# Patient Record
Sex: Male | Born: 1945 | Race: Black or African American | Hispanic: No | Marital: Married | Smoking: Former smoker
Health system: Southern US, Community
[De-identification: ages and names within clinical notes are randomized; demographics above are authoritative.]

## PROBLEM LIST (undated history)

## (undated) DIAGNOSIS — I10 Essential (primary) hypertension: Secondary | ICD-10-CM

## (undated) DIAGNOSIS — E559 Vitamin D deficiency, unspecified: Secondary | ICD-10-CM

## (undated) DIAGNOSIS — N529 Male erectile dysfunction, unspecified: Secondary | ICD-10-CM

## (undated) DIAGNOSIS — A0472 Enterocolitis due to Clostridium difficile, not specified as recurrent: Secondary | ICD-10-CM

## (undated) DIAGNOSIS — G4730 Sleep apnea, unspecified: Secondary | ICD-10-CM

## (undated) DIAGNOSIS — N393 Stress incontinence (female) (male): Secondary | ICD-10-CM

## (undated) DIAGNOSIS — C61 Malignant neoplasm of prostate: Secondary | ICD-10-CM

## (undated) DIAGNOSIS — E7800 Pure hypercholesterolemia, unspecified: Secondary | ICD-10-CM

## (undated) DIAGNOSIS — M8580 Other specified disorders of bone density and structure, unspecified site: Secondary | ICD-10-CM

## (undated) DIAGNOSIS — E119 Type 2 diabetes mellitus without complications: Secondary | ICD-10-CM

## (undated) DIAGNOSIS — K760 Fatty (change of) liver, not elsewhere classified: Secondary | ICD-10-CM

## (undated) HISTORY — PX: FOOT SURGERY: SHX648

## (undated) HISTORY — PX: OTHER SURGICAL HISTORY: SHX169

## (undated) HISTORY — PX: LIVER BIOPSY: SHX301

---

## 2001-03-01 ENCOUNTER — Encounter: Payer: Self-pay | Admitting: Emergency Medicine

## 2001-03-01 ENCOUNTER — Emergency Department (HOSPITAL_COMMUNITY): Admission: EM | Admit: 2001-03-01 | Discharge: 2001-03-01 | Payer: Self-pay | Admitting: Emergency Medicine

## 2001-07-02 ENCOUNTER — Ambulatory Visit (HOSPITAL_BASED_OUTPATIENT_CLINIC_OR_DEPARTMENT_OTHER): Admission: RE | Admit: 2001-07-02 | Discharge: 2001-07-02 | Payer: Self-pay | Admitting: Family Medicine

## 2001-08-01 ENCOUNTER — Ambulatory Visit (HOSPITAL_COMMUNITY): Admission: RE | Admit: 2001-08-01 | Discharge: 2001-08-01 | Payer: Self-pay | Admitting: Gastroenterology

## 2001-08-20 ENCOUNTER — Ambulatory Visit (HOSPITAL_BASED_OUTPATIENT_CLINIC_OR_DEPARTMENT_OTHER): Admission: RE | Admit: 2001-08-20 | Discharge: 2001-08-20 | Payer: Self-pay | Admitting: Family Medicine

## 2001-09-03 ENCOUNTER — Encounter: Payer: Self-pay | Admitting: General Surgery

## 2001-09-08 ENCOUNTER — Encounter (INDEPENDENT_AMBULATORY_CARE_PROVIDER_SITE_OTHER): Payer: Self-pay | Admitting: *Deleted

## 2001-09-08 ENCOUNTER — Ambulatory Visit (HOSPITAL_COMMUNITY): Admission: RE | Admit: 2001-09-08 | Discharge: 2001-09-09 | Payer: Self-pay | Admitting: General Surgery

## 2001-09-08 ENCOUNTER — Encounter: Payer: Self-pay | Admitting: General Surgery

## 2001-12-16 ENCOUNTER — Encounter: Payer: Self-pay | Admitting: Family Medicine

## 2001-12-16 ENCOUNTER — Encounter: Admission: RE | Admit: 2001-12-16 | Discharge: 2001-12-16 | Payer: Self-pay | Admitting: Family Medicine

## 2002-08-17 ENCOUNTER — Encounter: Admission: RE | Admit: 2002-08-17 | Discharge: 2002-08-17 | Payer: Self-pay | Admitting: Family Medicine

## 2002-08-17 ENCOUNTER — Encounter: Payer: Self-pay | Admitting: Family Medicine

## 2003-05-06 ENCOUNTER — Encounter: Admission: RE | Admit: 2003-05-06 | Discharge: 2003-05-06 | Payer: Self-pay | Admitting: Family Medicine

## 2004-09-12 ENCOUNTER — Encounter: Admission: RE | Admit: 2004-09-12 | Discharge: 2004-09-12 | Payer: Self-pay | Admitting: Family Medicine

## 2004-11-10 ENCOUNTER — Encounter: Admission: RE | Admit: 2004-11-10 | Discharge: 2004-11-10 | Payer: Self-pay | Admitting: Family Medicine

## 2006-01-15 ENCOUNTER — Encounter: Admission: RE | Admit: 2006-01-15 | Discharge: 2006-01-15 | Payer: Self-pay | Admitting: Family Medicine

## 2006-08-27 ENCOUNTER — Encounter: Admission: RE | Admit: 2006-08-27 | Discharge: 2006-08-27 | Payer: Self-pay | Admitting: Family Medicine

## 2006-10-26 ENCOUNTER — Emergency Department (HOSPITAL_COMMUNITY): Admission: EM | Admit: 2006-10-26 | Discharge: 2006-10-26 | Payer: Self-pay | Admitting: Emergency Medicine

## 2006-11-14 ENCOUNTER — Encounter: Admission: RE | Admit: 2006-11-14 | Discharge: 2007-02-12 | Payer: Self-pay | Admitting: Otolaryngology

## 2006-11-26 ENCOUNTER — Encounter: Admission: RE | Admit: 2006-11-26 | Discharge: 2006-11-26 | Payer: Self-pay | Admitting: Family Medicine

## 2007-07-01 ENCOUNTER — Ambulatory Visit (HOSPITAL_COMMUNITY): Admission: RE | Admit: 2007-07-01 | Discharge: 2007-07-01 | Payer: Self-pay | Admitting: Urology

## 2007-07-24 ENCOUNTER — Encounter (INDEPENDENT_AMBULATORY_CARE_PROVIDER_SITE_OTHER): Payer: Self-pay | Admitting: Urology

## 2007-07-24 ENCOUNTER — Inpatient Hospital Stay (HOSPITAL_COMMUNITY): Admission: RE | Admit: 2007-07-24 | Discharge: 2007-07-26 | Payer: Self-pay | Admitting: Urology

## 2008-06-11 ENCOUNTER — Encounter: Admission: RE | Admit: 2008-06-11 | Discharge: 2008-06-11 | Payer: Self-pay | Admitting: Family Medicine

## 2008-08-27 ENCOUNTER — Inpatient Hospital Stay (HOSPITAL_COMMUNITY): Admission: EM | Admit: 2008-08-27 | Discharge: 2008-08-30 | Payer: Self-pay | Admitting: Internal Medicine

## 2008-08-30 ENCOUNTER — Encounter (INDEPENDENT_AMBULATORY_CARE_PROVIDER_SITE_OTHER): Payer: Self-pay | Admitting: Internal Medicine

## 2010-04-14 IMAGING — CR DG CHEST 2V
2 series · 2 of 2 positions shown · non-contrast
Comparison: 07/18/2007

CLINICAL DATA: Stroke, hypertension, question pneumonia, altered
mental status

CHEST - 2 VIEW

[w chest pa]
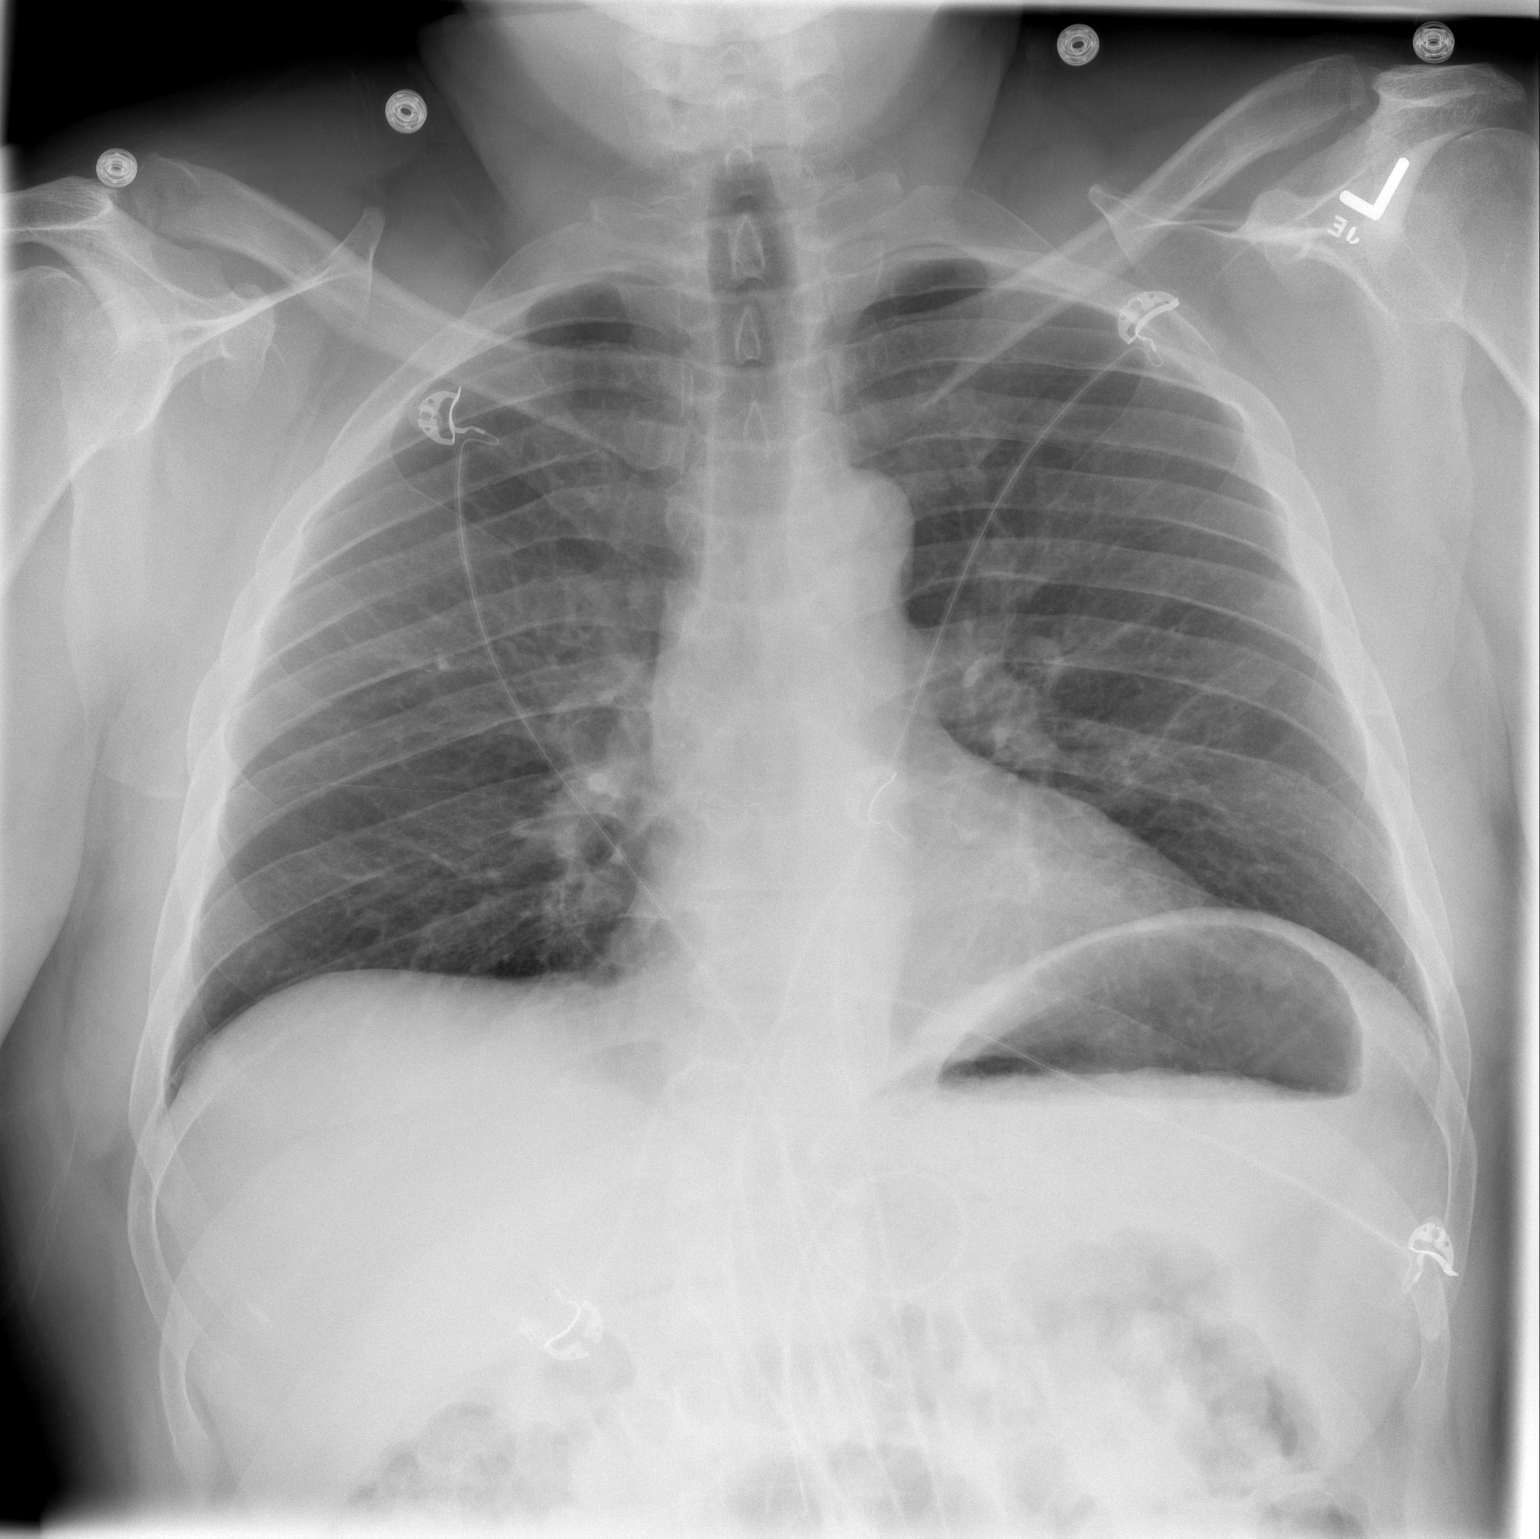

[w chest lat]
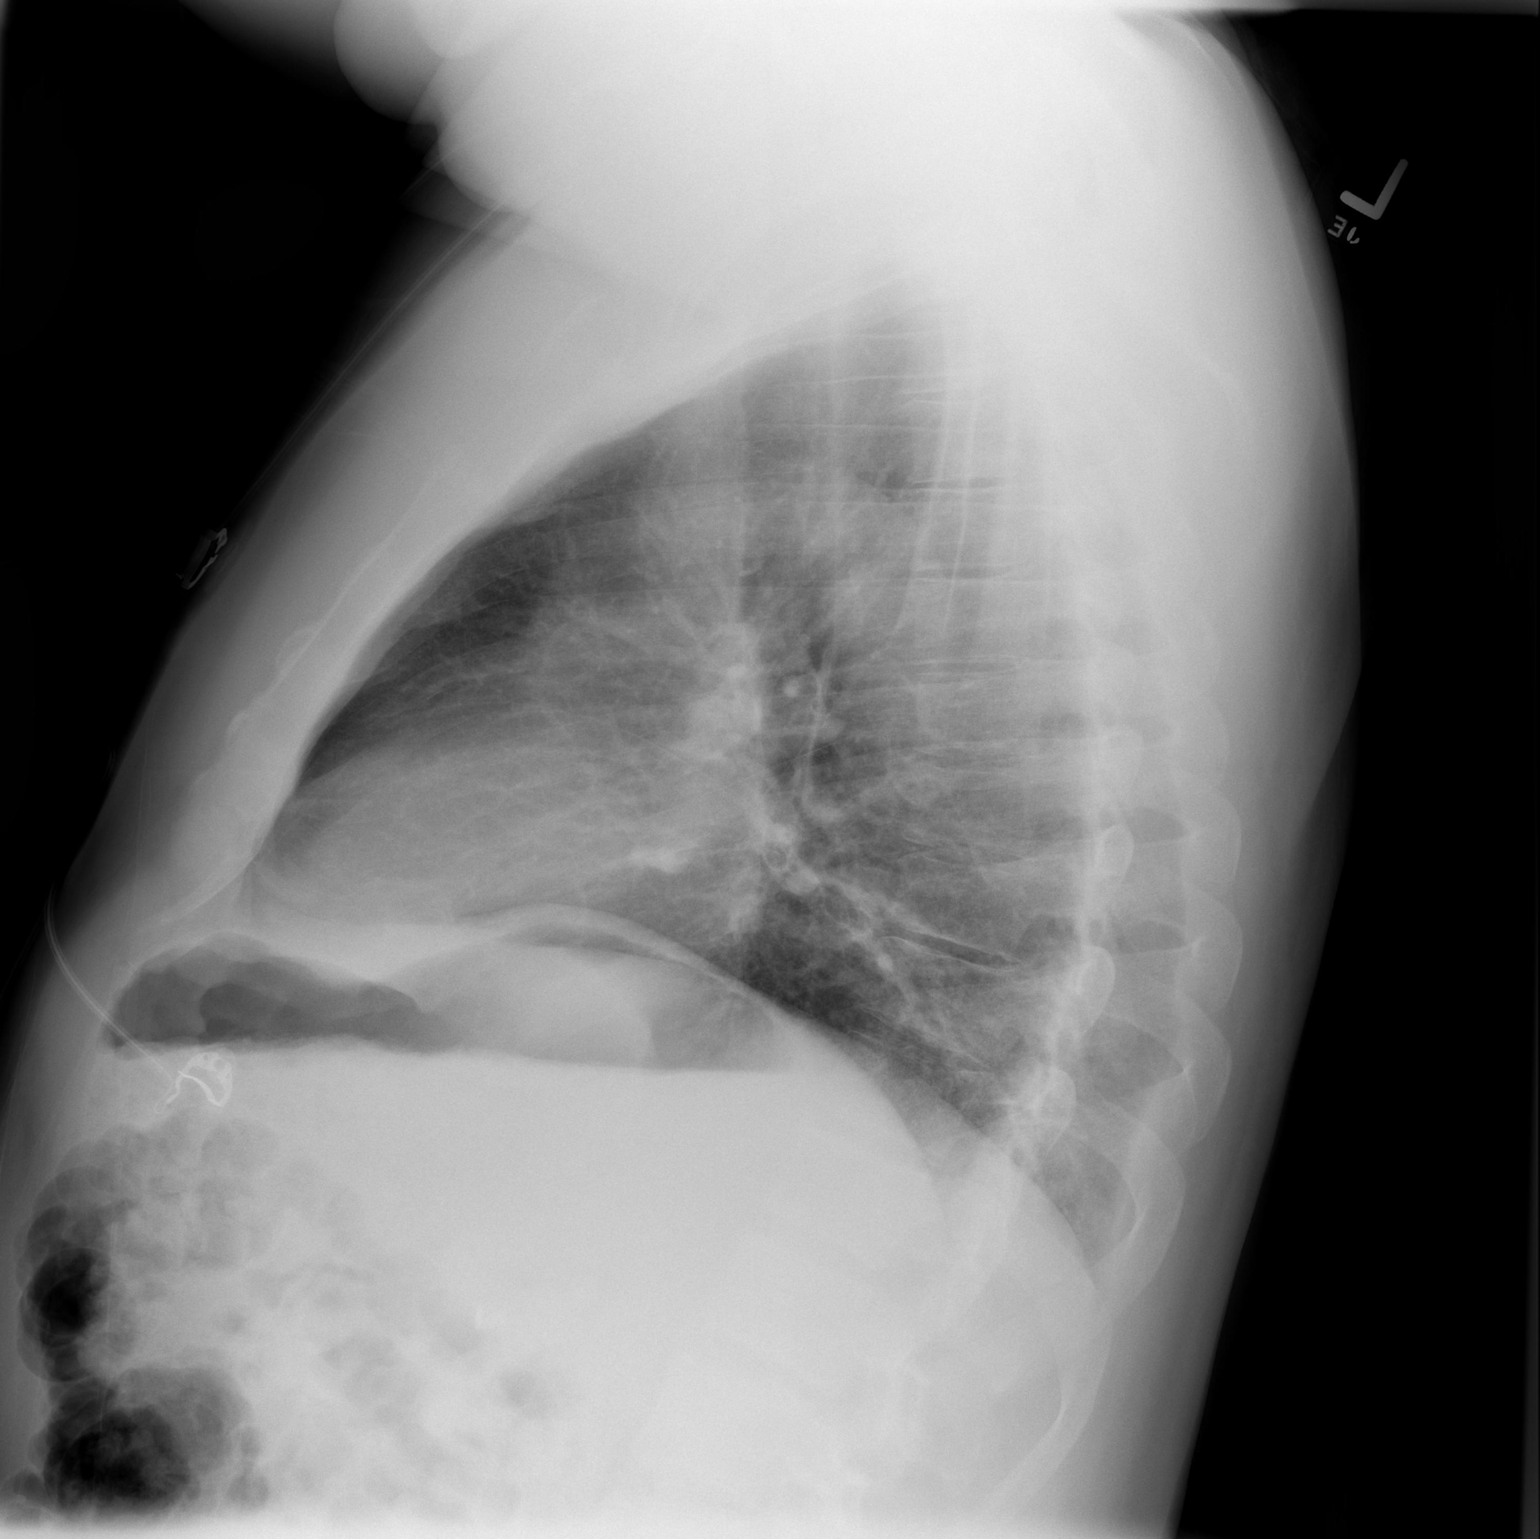

[2 of 2 positions shown; findings below may reference images not displayed]

FINDINGS: Normal heart size, mediastinal contours, and pulmonary vascularity.
Minimal bronchitic changes.
Lungs otherwise clear.
Bones unremarkable.
IMPRESSION: Minimal bronchitic changes.

## 2010-06-04 LAB — CBC
HCT: 43.1 % (ref 39.0–52.0)
Hemoglobin: 14.8 g/dL (ref 13.0–17.0)
MCHC: 34.4 g/dL (ref 30.0–36.0)
Platelets: 181 10*3/uL (ref 150–400)
RDW: 14 % (ref 11.5–15.5)
WBC: 7.1 10*3/uL (ref 4.0–10.5)

## 2010-06-04 LAB — CARDIAC PANEL(CRET KIN+CKTOT+MB+TROPI)
CK, MB: 4.5 ng/mL — ABNORMAL HIGH (ref 0.3–4.0)
Total CK: 303 U/L — ABNORMAL HIGH (ref 7–232)
Troponin I: 0.01 ng/mL (ref 0.00–0.06)

## 2010-06-04 LAB — LIPID PANEL
HDL: 29 mg/dL — ABNORMAL LOW (ref >39)
LDL Cholesterol: 88 mg/dL (ref 0–99)
Triglycerides: 69 mg/dL (ref <150)

## 2010-06-04 LAB — COMPREHENSIVE METABOLIC PANEL
AST: 27 U/L (ref 0–37)
Albumin: 3.7 g/dL (ref 3.5–5.2)
Alkaline Phosphatase: 46 U/L (ref 39–117)
Chloride: 104 mEq/L (ref 96–112)
GFR calc Af Amer: 58 mL/min — ABNORMAL LOW (ref >60)
Potassium: 3.8 mEq/L (ref 3.5–5.1)
Sodium: 140 mEq/L (ref 135–145)
Total Bilirubin: 0.8 mg/dL (ref 0.3–1.2)

## 2010-06-04 LAB — GLUCOSE, CAPILLARY
Glucose-Capillary: 100 mg/dL — ABNORMAL HIGH (ref 70–99)
Glucose-Capillary: 107 mg/dL — ABNORMAL HIGH (ref 70–99)
Glucose-Capillary: 93 mg/dL (ref 70–99)

## 2010-06-04 LAB — T4, FREE: Free T4: 1.05 ng/dL (ref 0.80–1.80)

## 2010-06-04 LAB — HEMOGLOBIN A1C: Hgb A1c MFr Bld: 5.3 % (ref 4.6–6.1)

## 2010-06-04 LAB — HOMOCYSTEINE: Homocysteine: 12.9 umol/L (ref 4.0–15.4)

## 2010-06-04 LAB — VITAMIN B12: Vitamin B-12: 1020 pg/mL — ABNORMAL HIGH (ref 211–911)

## 2010-06-04 LAB — URINALYSIS, ROUTINE W REFLEX MICROSCOPIC
Bilirubin Urine: NEGATIVE
Ketones, ur: NEGATIVE mg/dL
Nitrite: NEGATIVE

## 2010-06-04 LAB — RPR: RPR Ser Ql: NONREACTIVE

## 2010-07-11 NOTE — Op Note (Signed)
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   POSTOPERATIVE DIAGNOSIS:  Clinically-localized adenocarcinoma of  prostate (clinical stage T1C N0 M0).   PROCEDURES:  1. Robotic-assisted laparoscopic radical prostatectomy (bilateral      partial nerve-sparing).  2. Bilateral laparoscopic pelvic lymphadenectomy.   SURGEON:  Heloise Purpura, MD   ASSISTANT:  Melina Schools, MD   ANESTHESIA:  General.   COMPLICATIONS:  None.   ESTIMATED BLOOD LOSS:  200 mL.   INTRAVENOUS FLUIDS:  2000 mL of lactated Ringer's.   SPECIMENS:  1. Prostate and seminal vesicles.  2. Right pelvic lymph nodes.  3. Left pelvic lymph nodes.   DISPOSITION OF SPECIMENS:  To pathology.   DRAINS:  1. A 20-French Coude catheter.  2. A #19 Blake pelvic drain.   INDICATIONS:  Mr. Jeffrey [name] is a 65 year old gentleman with high-risk,  clinically-localized adenocarcinoma of the prostate.  After a discussion  regarding management options for treatment, he elected to proceed with  surgical therapy and the above procedures.  Potential risks,  complications and alternative options were discussed with the patient in  detail and informed consent was obtained.   DESCRIPTION OF PROCEDURE:  The patient was taken to the operating room  and a general anesthetic was administered.  He was given preoperative  antibiotics, placed in the dorsal lithotomy position, and prepped and  draped in the usual sterile fashion.  Next a preoperative time-out was  performed.  A Foley catheter was then inserted into the bladder and a  site  was selected just superior to the umbilicus for placement of the  camera port.  This was placed using a standard open Hasson technique.  This allowed entry into the peritoneal cavity under direct vision and  without difficulty.  A 12-mm port was then placed and a pneumoperitoneum  was established.  The 0-degree lens was used to inspect the abdomen and  there was no evidence of any intra-abdominal injuries or other  abnormalities.  The remaining ports were then placed.  Bilateral 8-mm  robotic ports were placed 10 cm lateral to and just inferior to the  camera port site.  An additional 8-mm robotic port was placed in the far  left lateral abdominal wall.  A 5-mm port was placed between the camera  port and the right robotic port.  An additional 12-mm port was placed in  the far right lateral abdominal wall for laparoscopic assistance.  All  ports were placed under direct vision and without difficulty.  The  surgical cart was then docked.  With the aid of the cautery scissors,  the bladder was reflected posteriorly allowing entry into the space of  Retzius and identification of  the endopelvic fascia and prostate.  The  endopelvic fascia was then incised from the apex back to the base of  prostate bilaterally and the underlying levator muscle fibers were swept  laterally off the prostate.  This isolated the dorsal venous complex,  which was then stapled and divided with a 45-mm flex ETS stapler.  The  bladder neck was then identified with the aid of Foley catheter  manipulation and was divided anteriorly, thereby exposing the Foley  catheter.  The catheter balloon was deflated and the catheter was  brought into the operative field and used to retract the prostate  anteriorly.  This helped to expose the posterior bladder neck, which was  then divided and dissection proceeded between the bladder neck and  prostate until the vasa deferentia and seminal vesicles were identified.  The vasa  deferentia were isolated, divided and lifted anteriorly.  The  seminal vesicles were dissected down to their tips with care to control  seminal vesicle arterial blood supply and then lifted anteriorly.  The  space between Denonvilliers fascia and the anterior rectum was bluntly  developed, thereby isolating the vascular pedicles of the prostate.  The  lateral prostatic fascia was incised sharply, allowing the neurovascular  bundles to be released posteriorly and laterally.  Hem-o-lok clips were  used to ligate the vascular pedicles of the prostate.  Cold scissors  dissection was then used to divide the vascular pedicles of the prostate  and care was taken to perform a partial nerve sparing procedure making  sure to leave at least a few millimeters of fatty tissue along the  posterolateral aspect of the prostate.  The neurovascular bundles were  then swept off the apex of the prostate and urethra and the urethra was  sharply divided, allowing the prostate specimen to be disarticulated.  The pelvis was then copiously irrigated and hemostasis was ensured.  There was no evidence of a rectal injury.  Attention then turned to the  right pelvic sidewall.  The fibrofatty tissue between the external iliac  vein, confluence of the iliac vessels, hypogastric artery and Cooper's  ligament was dissected free from the pelvic sidewall with care to  preserve the obturator nerve.  Hem-o-lok clips were used for  lymphostasis and hemostasis.  The specimen was then passed off for  permanent pathologic analysis.  An identical procedure was then  performed on the contralateral side.  Attention then turned to the  urethral anastomosis.  A 2-0 Vicryl slip-knot was placed between  Denonvilliers fascia, the posterior bladder neck and the posterior  urethra to reapproximate these structures.  A double-armed 3-0 Monocryl  suture was then used to perform a 360-degree running tension-free  anastomosis between the  bladder neck and urethra.  A new 20-French  straight catheter was inserted into the bladder and irrigated.  There  were no blood clots within the bladder and the anastomosis appeared to  be watertight.  A #19 Blake drain was then brought through the left  robotic port and appropriately positioned in the pelvis.  It was secured  to the skin with a nylon suture.  The surgical cart was then undocked.  The right lateral 12-mm port site was closed with a 0 Vicryl suture  placed in the aid of a Carter-Thomason device.  All remaining ports were  then removed under direct vision and the prostate specimen was removed  intact within the Endopouch retrieval bag.  This fascial opening at the  umbilicus was then closed with  a running 0 Vicryl suture.  All port  sites were injected with 0.25% Marcaine and reapproximated at the skin  level with staples.  Sterile dressings were applied.  The patient  appeared to tolerate the procedure well and without complications.  He  was able to be extubated and transferred to the recovery unit in  satisfactory condition.     Heloise Purpura, MD  Electronically Signed    LB/MEDQ  D:  07/24/2007  T:  07/24/2007  Job:  [id number]

## 2010-07-11 NOTE — H&P (Signed)
[name], [name]              ACCOUNT NO.:  [id number]   MEDICAL RECORD NO.:  [id number]          PATIENT TYPE:  INP   LOCATION:  3039                         FACILITY:  [facility]   PHYSICIAN:  Richarda Overlie, MD       DATE OF BIRTH:  04/06/1945   DATE OF ADMISSION:  08/27/2008  DATE OF DISCHARGE:                              HISTORY & PHYSICAL   PRIMARY CARE PHYSICIAN:  Unassigned.   SUBJECTIVE:  This is a 65 year old male with a history of dyslipidemia,  hypertension, who presented to the ER at [facility],  brought in by his wife because of altered mental status and inability to  remember any events related to his personal life as well as his recent  travel to visit his mother, where he has been staying since last Sunday.  The patient was found to be disoriented and confused this morning and  could not answer any questions appropriately.  The wife got concerned  and brought him to the ER at [facility], where he told  that the possibility of stroke could not be ruled out.  The patient was  also found to have some dysarthria and a frontal headache but no obvious  unilateral weakness, no gross focal neurologic deficits.  The patient  had a negative evaluation and requested transfer to [facility] for further evaluation.  At the ER, the patient's CT scan was  negative, and his blood work was essentially negative.   PAST MEDICAL HISTORY:  Hypertension.  Dyslipidemia.  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  In the father, no history of CVA or diabetes or coronary artery disease.   ALLERGIES:  TETRACYCLINE.   PHYSICAL EXAMINATION:  VITAL SIGNS:  Blood pressure 148/ 80, pulse of  77, respirations 22 and temperature 98.4.  GENERAL:   The patient appears currently to be comfortable in no acute  distress, is alert and oriented x3.  HEENT: Pupils equal and reactive.  Extraocular movements intact.  LUNGS:  Clear to auscultation bilaterally.  No wheezes or crackles or  rhonchi.  CARDIOVASCULAR:  Regular rate and rhythm.  No appreciable murmurs, rubs  or gallops.  ABDOMEN:  Soft, nontender, nondistended.  EXTREMITIES:  Without cyanosis, clubbing or edema.  NEUROLOGIC:  Cranial nerves II-XII grossly intact.  No gross focal  neurologic deficits.  Gait without any disturbance.  Motor and strength  intact in bilateral upper and lower extremities.  Deep tendon reflexes  2+ bilaterally.  LYMPH:  No palpable axillary, inguinal lymphadenopathy.   EKG shows normal sinus rhythm with a right axis deviation.   Sodium 141, potassium 4.2, chloride 107, bicarb 27, anion gap 7, glucose  106, BUN 16, creatinine 1.47, calcium 10.1.  WBCs 7.7, hemoglobin 15.7,  hematocrit 45.3, platelet count was 202.   CT scan of the head without contrast was negative.   Ultrasound did not  show any obvious stenosis.   HOME MEDICATIONS:  Lisinopril/hydrochlorothiazide 20/12.5 p.o. daily  , Allegra 180 mg p.o. daily,  Crestor on 10 mg every other day.   ASSESSMENT AND PLAN:  Confusion, now resolved.  Differential could  include atypical migraine versus seizure versus transient ischemic  attack/cerebrovascular accident.  The patient will be admitted to a tele  bed with neuro checks.  Neuro checks every 2 hours.  Will obtain an MRI,  MRA of the brain in the morning to rule out a CVA.  The patient will  also have a 2-D echo.  Will obtain a chest x-ray PA and lateral, an EEG  in the morning to rule out any underlying seizure disorder.  The patient  is afebrile.  No meningismus.  Therefore, I doubt  meningitis/encephalitis.  Will evaluate for risk factors.  Will do a  lipid panel in the morning.  Because of the patient's complaints of  altered memory on  and off for the last several months, will do a basic  dementia workup with thyroid function tests, B12, RPR, urine drug  screen.  Will also obtain a PT/OT consultation in the morning.  He is a  full code.      Richarda Overlie, MD  Electronically Signed     NA/MEDQ  D:  08/27/2008  T:  08/27/2008  Job:  [id number]

## 2010-07-11 NOTE — Consult Note (Signed)
[name], [name]              ACCOUNT NO.:  [id number]   MEDICAL RECORD NO.:  [id number]          PATIENT TYPE:  INP   LOCATION:  3039                         FACILITY:  [facility]   PHYSICIAN:  Levert Feinstein, MD          DATE OF BIRTH:  1945-10-23   DATE OF CONSULTATION:  DATE OF DISCHARGE:  08/30/2008                                 CONSULTATION   HISTORY:  The patient is a 65 year old right-handed African American  male, presenting with acute onset of memory loss on Friday August 27, 2008.   He had a past medical history of dyslipidemia, hypertension, was  visiting and taking care of his sick mother, and woke up on Friday  morning, wife noticed he could not remember anything.  The wife found he  was disoriented, confused, but there were no focal signs, he was  initially taken to a local ER at [facility], later  transferred to the [facility].   The patient reports memory loss lasted about 6-7 hours, he could  remember the conversation of transferring him, and his ride to [facility] that Friday afternoon.  He is back to his normal self now, this is  the initial episode.  He denies dysarthria, aphasia, and lateralized  motor sensory deficit.   REVIEW OF SYSTEMS:  Pertinent as above, he was previously elevated by  [facility] neurologist for gait difficulty, determined to be left inner  ear problem, but I do not have the exact name, or the record.   PAST MEDICAL HISTORY:  Hypertension and hyperlipidemia.   FAMILY HISTORY:  Mother has hypertension and hyperlipidemia.   ALLERGY:  TETRACYCLINE.   SOCIAL HISTORY:  Denies smoking, drinking, currently unemployed.   CURRENT MEDICATIONS:  Aspirin, hydrochlorothiazide, lisinopril, Crestor,  and labetalol p.r.n.   PHYSICAL EXAMINATION:  VITAL SIGNS:  Temperature 98.2, heart rate of 88,  blood pressure 125/77, and saturating 96%.  CARDIAC:  Regular rate and rhythm.  PULMONARY:  Clear to auscultation bilaterally.  NECK:   Supple, no carotid bruits.  NEUROLOGIC:  He is awake, alert, oriented to history taking, care of  conversation.  Cranial nerves II through XII are normal.  Pupils  equal,  round, and reactive to light.  Extraocular movements are full.  Facial  sensation is normal.  Visual fields are full on confrontational test.   Motor examination normal tone, bulk, and strength.  Deep tendon reflex,  brisk and symmetric.  Sensory intact to light touch, pinprick,  and  coordination normal finger-to-nose, heel-to-shin.  Gait was deferred.   MRI of the brain without contrast, no acute change.  MRA of the brain,  there was mild pouching, 2 mm, on the right M1 segment.   MRA of the neck has demonstrated mild irregularity, left worse than  right, but there was no hemodynamic significant stenosis.   LABORATORY EVALUATION:  Normal homocystine; lipid profile; CMP was  mildly elevated with creatinine 1.5, glucose 101; CBC was normal.   ASSESSMENT AND PLAN:  35. A 65 year old gentleman presenting with transient global amnesia      lasting  6-7 hours, now resolved.  workup were negative.  He will      benefit a followup with neurologist that can be done at [facility]      neurologist.  2. Address vascular risk factor, weight loss, regular healthy diet,      blood pressure control, and daily aspirin.      Levert Feinstein, MD  Electronically Signed     YY/MEDQ  D:  08/30/2008  T:  08/31/2008  Job:  [id number]

## 2010-07-11 NOTE — Discharge Summary (Signed)
NAMELOWRY, BALA NO.:  [id number]   MEDICAL RECORD NO.:  [id number]          PATIENT TYPE:  INP   LOCATION:  3039                         FACILITY:  [facility]   PHYSICIAN:  Acey Lav, MD  DATE OF BIRTH:  [birth year]   DATE OF ADMISSION:  08/27/2008  DATE OF DISCHARGE:                               DISCHARGE SUMMARY   DISCHARGE DIAGNOSES:  1. Transient global amnesia, unclear cause.  2. Hypertension.  3. Hyperlipidemia.  4. Mild narrowing of the proximal right vertebral artery.  5. Minimal irregularity of the proximal right internal carotid artery      without stenosis.  6. Two areas of focal narrowing proximal left internal carotid artery      suggestive of plaque or possible ulceration but no evidence of      hemodynamically significant stenosis.  7. Nonspecific asymptomatic elevation of creatinine phosphokinase.   PERTINENT LABORATORY DATA AND RADIOGRAPHIC STUDIES:  MRI of brain and  MRA of the neck:  MRI head no acute infarct, no intracranial hemorrhage,  no intracranial abnormality, no hydrocephalus, no acute infarcts.  MRA  of the head:  There is mild narrowing of the supraclinoid aspect of the  right internal carotid artery, fetal-type origin of the posterior  cerebral arteries bilaterally.  Right vertebral artery is dominant.  Ectatic vertebral arteries and basilar artery.  Nonvisualization of the  PICA's, mid branch vessel irregularity.  Additionally, there is a 2-mm  bulge distal to the M1 segment of left middle cerebral artery projecting  posteriorly.  A vessel arising from this cannot be confirmed, and  therefore, a tiny aneurysm cannot be excluded.  Otherwise no evidence of  aneurysm.   MRA of the neck:  Left vertebral artery origin appears to be at the  aortic arch immediately adjacent to left subclavian artery, mild  narrowing proximal to the right vertebral artery.  Right vertebral  artery is slightly dominant sized.  Minimal  irregularity proximal right  internal carotid artery without measurable stenosis.  Two areas of focal  proximal left internal carotid area narrowing suggestive of plaque or  possible ulceration at the level of maximal stenosis in the left  internal carotid artery.  It measures 3.8 mm whereas behind the bulb it  measures 4.1 mm and is not hemodynamically significant.   Chest x-ray:  No acute abnormalities other than some minimal bronchitic  changes.   Echocardiogram done on the 5th shows normal systolic ejection fraction  with a range of 60-65%.  The images were inadequate to evaluate LV wall  function.  Aortic valve was poorly visualized but structurally normal  from what could be seen.  No stenosis, no regurgitation.  Aortic valve  normal in size.  The ascending aorta normal in size.  Mitral valve  poorly visualized, no significant regurgitation.  Left atrium normal in  size.  Atrial septum poorly visualized.  Right ventricle catheter was  normal.  Wall thickness normal.  Systolic function normal.  Systolic  pressure within normal.  Pulmonary valve poorly visualized.  No  significant regurgitation.  Tricuspid valve poorly visualized.  No  significant  regurgitation.  Pulmonary artery poorly visualized.  Right  atrium normal size.  No pericardial effusion.   RPR negative.  Hemoglobin A1c 5.3.  Vitamin B12 level 1020.  Homocysteine level 12.9.  Lipid profile:  Cholesterol 131, triglycerides  69, HDL 29, LDL 88.  Thyroid stimulating hormone 1.551, T4 1.05.   HOSPITAL COURSE:  Briefly, this is a 65 year old African American  gentleman with history of hyperlipidemia and hypertension who presented  to the emergency room at [facility], brought there by  his wife due to acute onset of confusion and inability to remember any  recent events related to personal life as well as recent travel to visit  his sick mother.  The patient was brought, was found to be disoriented  in the  emergency room and confused and had headache.  He also had some  dysarthria but otherwise had no focal findings.  He was brought to [facility] emergency department where CT of the head showed no abnormalities.  By the time he was seen emergency room, his amnesia had resolved.  He  was admitted to the neurology floor.  His lipid profile was checked.  He  was initially allowed to be progressively hypertensive, and then he was  started back on his home medications for blood pressure and cholesterol.  He was started on aspirin which caused some dyspepsia and then was  restarted on an enteric coated aspirin.  He had workup including MRI and  MRA of the brain which is described above.  As mentioned, there is a  bulge in the M1 segment of the MCA on the left middle cerebral artery in  which exclusion of an aneurysm cannot be made, although there is no  actual aneurysm that was seen.  He also had some nonsignificant  narrowing of his internal carotid artery was noted on MRA of the neck.  The patient was observed on telemetry and had no events on telemetry.  His blood sugars were normal, and he was doing well.  He was seen by  physical therapy and  occupational therapy who all found that he was  functioning completely independently and did not need to have any  further needs from physical therapy/occupational therapy, I called  neurology because there had been some mention of doing an EEG initially.  After taking to Dr. Terrace Arabia, she felt that EEG would be essentially a very  low yield at this point almost 4 days out the patient's event.  She felt  the patient suffered from global amnesia.  She thought he could be  evaluated as an outpatient.  However, the patient really wanted to be  seen by a neurologist to have all of his diagnoses clarified.  Therefore, Dr. Terrace Arabia is going to see the patient today.  I anticipate  discharging the patient to home today with a statin and his home   lisinopril/hydrochlorothiazide combination along with an enteric-coated  aspirin.   DISCHARGE INSTRUCTIONS:  The patient is to follow-up with primary care  physician and also to follow-up with a neurologist with either will be  Dr. Zannie Cove group with [facility] or with a  neurologist he has known at [facility].  He may  potentially get an EEG as an outpatient at that point in time.      Acey Lav, MD  Electronically Signed     CV/MEDQ  D:  08/30/2008  T:  08/30/2008  Job:  [id number]   cc:  Levert Feinstein, MD

## 2010-07-14 NOTE — Op Note (Signed)
[place]. [facility]  Patient:    [name], [name] Visit Number: [id number] MRN: [id number]          Service Type: DSU Location: [phone] Attending Physician:  Arlis Porta Dictated by:   Adolph Pollack, M.D. Proc. Date: 09/08/01 Admit Date:  09/08/2001 Discharge Date: 09/09/2001   CC:         [name], M.D.  Anselmo Rod, M.D.   Operative Report  PREOPERATIVE DIAGNOSES: 1. Gallbladder polyp with abnormal liver function tests. 2. Left inguinal hernia.  POSTOPERATIVE DIAGNOSES: 1. Gallbladder polyp with abnormal liver function tests. 2. Left inguinal hernia.  PROCEDURE: 1. Laparoscopic cholecystectomy with intraoperative cholangiogram. 2. TruCut liver biopsy. 3. Left inguinal hernia repair with mesh.  SURGEON:  Adolph Pollack, M.D.  ASSISTANT:  Gita Kudo, M.D.  ANESTHESIA:  General.  INDICATIONS:  This patient is a 65 year old male with some chronic elevation of liver function tests.  He also has some abdominal pain.  An ultrasound demonstrated a mass in the gallbladder, which was felt to be consistent with a polyp.  A CT scan demonstrated a left inguinal hernia containing small bowel in it.  He now presents for elective above procedures.  TECHNIQUE:  His left groin was marked. He was placed supine on the operating table, and a general anesthetic was administered.  The abdomen and groin were sterilely prepped and draped.  A small subumbilical incision was made, incising the skin sharply.  The subcutaneous tissue was dissected bluntly until the fascia was identified, and a 1-1.5 cm incision was made in the fascia.  The peritoneal cavity was then entered bluntly and then under direct vision.  A pursestring suture of 0 Vicryl was placed around the fascial edges. A Hasson trocar was introduced into the peritoneal cavity and pneumoperitoneum created by insufflation of CO2 gas.  Next, he was placed in  the appropriate position, and the laparoscope was introduced.  I then placed an 11 mm trocar through a similar size incision into the epigastric region, and two 5 mm trocars were placed in the right midabdomen.  I began by using a TruCut needle and performing a liver biopsy, passing the TruCut needle through the anterior abdominal wall into the right lateral lobe of the liver.  A large core was obtained.  The biopsy site bleeding was controlled with the cautery.  Next, the fundus of the gallbladder was grasped, and omental adhesions to it were taken down with blunt dissection and cautery.  Once this was freed up, we were able to retract the fundus.  The infundibulum was grasped and completely mobilized.  I then identified the cystic duct/gallbladder junction and isolated this.  A clip was placed just above the cystic duct/gallbladder junction.  An incision was made at the cystic duct/gallbladder junction, and a cholangiocatheter was placed into the cystic duct.  A cholangiogram was then performed.  Using real time fluoroscopy and dilute contrast material, injection was performed into the cystic duct.  The contrast promptly entered the common bile duct and then drained into the duodenum without obvious obstruction.  The common and right and left hepatic ducts were also seen without evidence of obstruction.  The final report is pending the radiologist interpretation.  The cholangiocatheter was removed, and the cystic duct was then clipped three times proximally and divided.  The cystic artery was identified, isolated, clipped, and divided.  The gallbladder was dissected free from the liver bed intact using electrocautery.  It was  placed in an Endopouch bag. The gallbladder fossa was inspected and copiously irrigated, and no bleeding or bile leak was noted.  Next, I inspected the right lower quadrant region as he had been having some pain here.  No inguinal hernia was noted.  The appendix  was normal.  No inflammatory process was noted.  I did look into the left groin and noted the inguinal hernia on the left side.  The gallbladder was then removed through the subumbilical port, and the subumbilical fascial defect was closed under direct vision by tightening up and tieing down the pursestring suture.  The pneumoperitoneum was released, and the trocars were removed.  The skin incisions were closed with 4-0 Monocryl subcuticular stitches, followed by Steri-Strips and sterile dressings. Next, I changed my gloves and approached the left inguinal area.  0.5% Marcaine was infiltrated, both superficially and deep in the left inguinal area.  Then, an incision was made in the left groin and carried down through the subcutaneous tissue to the external oblique aponeurosis.  I injected more local anesthetic deep to this and then made an incision in the external oblique aponeurosis and split it in the direction of its fibers through the external ring medially and up toward the anterior-superior iliac spine laterally.  Using blunt dissection, I exposed the shelving edge of the inguinal ligament inferiorly and the internal oblique muscle and aponeurosis superiorly.  The ilioinguinal nerve was identified and a perineural block performed with Marcaine.  It was spared.  I encircled the spermatic cord and noticed a direct hernia sac adherent to the spermatic cord, which I was able to dissect free.  There was a moderate size direct hernia noted.  This was easily reducible.  A lipoma of the cord was identified, and the lipoma was stripped from the cord and dissected free from it.  Next, a ______ mesh was brought into the field and was anchored 1 cm medial to the pubic tubercle with a 2-0 Prolene suture.  The inferior aspect of the mesh was anchored to the shelving edge of the inguinal ligament with a running  2-0 Prolene suture until it was 1 cm lateral to the internal ring.  A slit  was cut in the mesh and wrapped around the spermatic cord.  The superior aspect of the mesh was then anchored to the internal oblique aponeurosis and muscle with interrupted 2-0 Vicryl sutures.  This allowed for more than adequate coverage of the direct defect.  Before I put the mesh in, I loosely approximated the attenuated transversalis fascia together with running 2-0 Vicryl suture.  Next, I wrapped the two tails around the cord, creating a new internal ring, and these two tails were anchored to the shelving edge of the inguinal ligament with 2-0 Prolene suture.  Following this, the external oblique aponeurosis was closed over the mesh and cord with a running 3-0 Vicryl suture.  The Scarpas fascia was closed with a running 2-0 Vicryl suture.  The skin was closed with 4-0 Monocryl subcuticular stitch.  Steri-Strips and sterile dressings were applied to all wounds.  He tolerated the procedures well without any apparent complications and was taken to the recovery room in satisfactory condition.    Dictated by:   Adolph Pollack, M.D. Attending Physician:  Arlis Porta DD:  09/08/01 TD:  09/09/01 Job: [phone]

## 2010-07-14 NOTE — Procedures (Signed)
[place]. [facility]  Patient:    [name], [name] Visit Number: [id number] MRN: [id number]          Service Type: END Location: ENDO Attending Physician:  Charna Elizabeth Dictated by:   Anselmo Rod, M.D. Proc. Date: 08/01/01 Admit Date:  08/01/2001 Discharge Date: 08/01/2001   CC:         [name], M.D.  Adolph Pollack, M.D.   Procedure Report  DATE OF BIRTH:  [birth year].  REFERRING PHYSICIAN:  [name], M.D.  PROCEDURE PERFORMED:  Colonoscopy.  ENDOSCOPIST:  Anselmo Rod, M.D.  INSTRUMENT USED:  Olympus video colonoscope.  INDICATIONS FOR PROCEDURE:  Trace guaiac positive stools and a history of left inguinal hernia and right lower quadrant pain in a 65 year old African-American male awaiting surgery (laparoscopic cholecystectomy and inguinal hernia repair).  Preoperative colonoscopy is being done to rule out colonic polyps, masses, hemorrhoids, etc.  PREPROCEDURE PREPARATION:  Informed consent was procured from the patient. The patient was fasted for eight hours prior to the procedure and prepped with a bottle of magnesium citrate and a gallon of NuLytely the night prior to the procedure.  PREPROCEDURE PHYSICAL:  The patient had stable vital signs.  Neck supple. Chest clear to auscultation.  S1, S2 regular.  Abdomen soft with normal bowel sounds.  DESCRIPTION OF PROCEDURE:  The patient was placed in the left lateral decubitus position and sedated with 70 mg of Demerol and 7 mg of Versed intravenously.  Once the patient was adequately sedated and maintained on low-flow oxygen and continuous cardiac monitoring, the Olympus video colonoscope was advanced from the rectum to the cecum with slight difficulty. There was some residual stool in the colon.  Multiple washes were done, small internal hemorrhoids were seen on retroflexion.  There were few scattered diverticula, no masses or polyps were present.  The  procedure was complete up to the cecum.  The appendicular orifice and the ileocecal valve were clearly visualized and photographed.  IMPRESSION: 1. Scattered diverticular disease (early). 2. Small nonbleeding internal hemorrhoids. 3. Some residual stool in the colon.  Very small lesions could have been    missed.  RECOMMENDATIONS: 1. Proceed with surgery as advised by Dr. Abbey Chatters. 2. High fiber diet. 3. Outpatient follow-up in the next two to three weeks.Dictated by:   Anselmo Rod, M.D. Attending Physician:  Charna Elizabeth DD:  08/01/01 TD:  08/04/01 Job: [id number]

## 2010-11-22 LAB — CBC
Hemoglobin: 16
RBC: 5.32
RDW: 13.9
WBC: 7.1

## 2010-11-22 LAB — BASIC METABOLIC PANEL
Calcium: 9.9
GFR calc Af Amer: 57 — ABNORMAL LOW
GFR calc non Af Amer: 47 — ABNORMAL LOW
Sodium: 141

## 2010-11-22 LAB — ABO/RH: ABO/RH(D): A POS

## 2010-11-22 LAB — TYPE AND SCREEN: Antibody Screen: NEGATIVE

## 2010-11-22 LAB — HEMOGLOBIN AND HEMATOCRIT, BLOOD: HCT: 41.7

## 2011-04-09 DIAGNOSIS — E782 Mixed hyperlipidemia: Secondary | ICD-10-CM | POA: Insufficient documentation

## 2013-02-10 DIAGNOSIS — K5790 Diverticulosis of intestine, part unspecified, without perforation or abscess without bleeding: Secondary | ICD-10-CM | POA: Insufficient documentation

## 2014-09-24 DIAGNOSIS — D229 Melanocytic nevi, unspecified: Secondary | ICD-10-CM | POA: Insufficient documentation

## 2014-10-07 ENCOUNTER — Encounter: Payer: Self-pay | Admitting: Radiation Oncology

## 2014-10-07 NOTE — Progress Notes (Addendum)
GU Location of Tumor / Histology: Prostate  If Prostate Cancer, Gleason Score is ( 4 + 3) and PSA is (8.25)     12/29/13 PSA -0.33 Testosterone - 539 and Vitamin D - 43  Biopsies of Prostate (if applicable) revealed  6/88/6484    Past/Anticipated interventions by urology, if any: Radical Prostatectomy in 2009  Past/Anticipated interventions by medical oncology, if any: None  Weight changes, if any:   Bowel/Bladder complaints, if any: Stress Incontinence  Nausea/Vomiting, if any: None  Pain issues, if any: None  SAFETY ISSUES:  Prior radiation? No  Pacemaker/ICD? No  Possible current pregnancy?N/A  Is the patient on methotrexate? 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

## 2014-10-13 ENCOUNTER — Ambulatory Visit
Admission: RE | Admit: 2014-10-13 | Discharge: 2014-10-13 | Disposition: A | Discharge status: Home / Self Care | Payer: No Typology Code available for payment source | Source: Ambulatory Visit | Attending: Radiation Oncology | Admitting: Radiation Oncology

## 2014-10-13 ENCOUNTER — Encounter: Payer: Self-pay | Admitting: Radiation Oncology

## 2014-10-13 VITALS — BP 146/74 | HR 76 | Temp 98.1°F | Ht 71.0 in | Wt 255.2 lb

## 2014-10-13 DIAGNOSIS — Z87891 Personal history of nicotine dependence: Secondary | ICD-10-CM | POA: Insufficient documentation

## 2014-10-13 DIAGNOSIS — Z8249 Family history of ischemic heart disease and other diseases of the circulatory system: Secondary | ICD-10-CM | POA: Diagnosis not present

## 2014-10-13 DIAGNOSIS — E119 Type 2 diabetes mellitus without complications: Secondary | ICD-10-CM | POA: Insufficient documentation

## 2014-10-13 DIAGNOSIS — Z510 Encounter for antineoplastic radiation therapy: Secondary | ICD-10-CM | POA: Diagnosis present

## 2014-10-13 DIAGNOSIS — G4733 Obstructive sleep apnea (adult) (pediatric): Secondary | ICD-10-CM | POA: Insufficient documentation

## 2014-10-13 DIAGNOSIS — Z833 Family history of diabetes mellitus: Secondary | ICD-10-CM | POA: Insufficient documentation

## 2014-10-13 DIAGNOSIS — E785 Hyperlipidemia, unspecified: Secondary | ICD-10-CM | POA: Insufficient documentation

## 2014-10-13 DIAGNOSIS — C61 Malignant neoplasm of prostate: Secondary | ICD-10-CM | POA: Diagnosis present

## 2014-10-13 DIAGNOSIS — N529 Male erectile dysfunction, unspecified: Secondary | ICD-10-CM | POA: Insufficient documentation

## 2014-10-13 DIAGNOSIS — Z823 Family history of stroke: Secondary | ICD-10-CM | POA: Diagnosis not present

## 2014-10-13 HISTORY — DX: Stress incontinence (female) (male): N39.3

## 2014-10-13 HISTORY — DX: Pure hypercholesterolemia, unspecified: E78.00

## 2014-10-13 HISTORY — DX: Other specified disorders of bone density and structure, unspecified site: M85.80

## 2014-10-13 HISTORY — DX: Fatty (change of) liver, not elsewhere classified: K76.0

## 2014-10-13 HISTORY — DX: Male erectile dysfunction, unspecified: N52.9

## 2014-10-13 HISTORY — DX: Vitamin D deficiency, unspecified: E55.9

## 2014-10-13 HISTORY — DX: Type 2 diabetes mellitus without complications: E11.9

## 2014-10-13 HISTORY — DX: Essential (primary) hypertension: I10

## 2014-10-13 HISTORY — DX: Sleep apnea, unspecified: G47.30

## 2014-10-13 HISTORY — DX: Malignant neoplasm of prostate: C61

## 2014-10-13 NOTE — Progress Notes (Signed)
[facility] NEW PATIENT EVALUATION  Name: [name] MRN: [id number]  Date:   10/13/2014           DOB: 04-27-1945  Status: outpatient   CC: No primary care provider on file.  Carolan Clines, MD , Dr. Dutch Gray   REFERRING PHYSICIAN: Carolan Clines, MD   DIAGNOSIS: PSA recurrent carcinoma the prostate   HISTORY OF PRESENT ILLNESS:  [name] is a 69 y.o. male who is seen today through the courtesy Dr. Gaynelle Arabian for discussion of possible salvage radiation therapy in the management of his PSA recurrent carcinoma the prostate.  He presented in the spring of 2009 with a rising PSA.  His PSA was 8.07 and had underwent ultrasound-guided biopsies by Dr. Gaynelle Arabian on 06/18/2007.  He was found have Gleason 8 (4+4) and also Gleason 7 (3+4) disease.  Gleason 8 was seen along the right apex and Gleason 7 was seen along the left base and left mid gland.  He was seen by Dr. Alinda Money who performed a laparoscopic robotic prostatectomy on 07/24/2007.  He was found have Gleason 7 (4+3) disease involving the right and left lobes.  Approximately 15% of the prostate contained carcinoma.  Surgical margins were free and there was no extracapsular extension appreciated.  Seminal vesicles were uninvolved.  His postoperative  PSA was undetectable.  His PSA was noted to be 0.07 in February 2013, rising to 0.33 by November 2015.  His most recent PSA was 0.38 on 09/15/2014.  He is seen today for consideration of salvage radiation therapy.  He does have infrequent stress incontinence and he does wear a pad.  He does have erectile dysfunction felt to be secondary to arterial insufficiency.  No GI difficulties.  PREVIOUS RADIATION THERAPY: No   PAST MEDICAL HISTORY:  has a past medical history of Prostate cancer; Erectile dysfunction; Male stress incontinence; Osteopenia; Vitamin D deficiency; Diabetes mellitus; Hypercholesteremia; Hypertension; Sleep apnea; and  Nonalcoholic fatty liver disease.     PAST SURGICAL HISTORY:  Past Surgical History  Procedure Laterality Date  . Cholecytectomy    . History of colonoscopy    . Foot surgery    . Laparoscopy repair of initial inguinal hernia    . Liver biopsy    . Hx of prostatect retropubic radical w/ nerve sparing laparoscopic       FAMILY HISTORY: family history includes Diabetes Mellitus I in his mother; Heart attack (age of onset: 28) in his father; Hypertension in his mother; Stroke in his mother. His father died of a heart attack at 30.  His mother is alive and lives with his sister at age 36.  No family history of prostate cancer.  SOCIAL HISTORY:  reports that he has quit smoking. His smoking use included Cigarettes. He has a 20 pack-year smoking history. He does not have any smokeless tobacco history on file. He reports that he does not drink alcohol.   Married, 3 children.  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   ALLERGIES: Tetracyclines & related   MEDICATIONS:  Current Outpatient Prescriptions  Medication Sig Dispense Refill  . amLODipine (NORVASC) 10 MG tablet Take 10 mg by mouth daily.    Marland Kitchen aspirin 325 MG tablet Take 325 mg by mouth daily.    . calcium-vitamin D (OSCAL WITH D) 500-200 MG-UNIT per tablet Take 1 tablet by mouth. Actually taking 600/400    . cholecalciferol (VITAMIN D) 1000 UNITS tablet  Take 1,000 Units by mouth daily.    . fexofenadine (ALLEGRA) 60 MG tablet Take 180 mg by mouth 2 (two) times daily.    Marland Kitchen glucosamine-chondroitin 500-400 MG tablet Take 1 tablet by mouth 3 (three) times daily.    Marland Kitchen lisinopril-hydrochlorothiazide (PRINZIDE,ZESTORETIC) 20-12.5 MG per tablet Take 1 tablet by mouth daily.    . Multiple Vitamin (MULTIVITAMIN) tablet Take 1 tablet by mouth daily.    . niacin 500 MG tablet Take 500 mg by mouth at bedtime.    . Omega-3 Fatty Acids (FISH OIL) 1000 MG CAPS Take by mouth.    Marland Kitchen PRESCRIPTION MEDICATION Inject into the  muscle 3 (three) times a week. Trimex as needed. Injectable    . PROPRANOLOL HCL ER PO Take 80 mg by mouth once.     No current facility-administered medications for this encounter.     REVIEW OF SYSTEMS:  Pertinent items are noted in HPI.    PHYSICAL EXAM:  height is 5\' 11"  (1.803 m) and weight is 255 lb 3.2 oz (115.758 kg). His temperature is 98.1 F (36.7 C). His blood pressure is 146/74 and his pulse is 76.   Alert and oriented 69 year old African-American male appearing his stated age.  Head and neck examination: Grossly unremarkable.  Abdomen: Without masses organomegaly.  Genitalia: Unremarkable to inspection.  Rectal: The prostate bed is flat and is without masses or nodularity.   LABORATORY DATA:  Lab Results  Component Value Date   WBC 7.1 08/28/2008   HGB 14.8 08/28/2008   HCT 43.1 08/28/2008   MCV 88.9 08/28/2008   PLT 181 08/28/2008   Lab Results  Component Value Date   NA 140 08/28/2008   K 3.8 08/28/2008   CL 104 08/28/2008   CO2 30 08/28/2008   Lab Results  Component Value Date   ALT 44 08/28/2008   AST 27 08/28/2008   ALKPHOS 46 08/28/2008   BILITOT 0.8 08/28/2008      IMPRESSION: PSA recurrent carcinoma the prostate.  I explained to [name] that clinical predictors for local recurrence alone, and thus possible salvage with radiation therapy include a positive margin/extracapsular extension, which she does not have.  His disease-free interval along with a slowly rising PSA does favor a local recurrence alone.  His initial PSA of less than 10 and Gleason score of 7 would be a weak predictor in his favor.  We discussed the fact that the strongest predictor for local recurrence alone is a positive margin.  Likelihood for durable remission would probably be less than one third.  We discussed the potential acute and late toxicities of radiation therapy.  We also discussed the timing of radiation therapy which should, ideally, be done between a PSA of 0.2-0.5  (earlier rather than later).  We also discussed bladder filling to minimize urinary toxicity.  He will think things over and get back in touch with me if he would like to consider radiation therapy.  I do recommend consideration of salvage radiation therapy.   PLAN: As discussed above.  I spent 45 minutes face to face with the patient and more than 50% of that time was spent in counseling and/or coordination of care.

## 2014-10-14 ENCOUNTER — Ambulatory Visit: Payer: No Typology Code available for payment source

## 2014-10-14 ENCOUNTER — Ambulatory Visit: Payer: No Typology Code available for payment source | Admitting: Radiation Oncology

## 2014-10-15 DIAGNOSIS — N183 Chronic kidney disease, stage 3 unspecified: Secondary | ICD-10-CM | POA: Insufficient documentation

## 2014-10-27 NOTE — Addendum Note (Signed)
Encounter addended by: Benn Moulder, RN on: 10/27/2014  9:10 AM<BR>     Documentation filed: Charges VN

## 2014-11-05 DIAGNOSIS — L281 Prurigo nodularis: Secondary | ICD-10-CM | POA: Insufficient documentation

## 2015-05-28 DEATH — deceased

## 2015-12-22 DIAGNOSIS — G4733 Obstructive sleep apnea (adult) (pediatric): Secondary | ICD-10-CM | POA: Insufficient documentation

## 2016-11-21 ENCOUNTER — Other Ambulatory Visit: Payer: Self-pay | Admitting: Internal Medicine

## 2016-11-21 DIAGNOSIS — R270 Ataxia, unspecified: Secondary | ICD-10-CM

## 2016-11-21 DIAGNOSIS — H9319 Tinnitus, unspecified ear: Secondary | ICD-10-CM

## 2016-12-02 ENCOUNTER — Ambulatory Visit
Admission: RE | Admit: 2016-12-02 | Discharge: 2016-12-02 | Disposition: A | Discharge status: Home / Self Care | Payer: PRIVATE HEALTH INSURANCE | Source: Ambulatory Visit | Attending: Internal Medicine | Admitting: Internal Medicine

## 2016-12-02 DIAGNOSIS — H9319 Tinnitus, unspecified ear: Secondary | ICD-10-CM

## 2016-12-02 DIAGNOSIS — R270 Ataxia, unspecified: Secondary | ICD-10-CM

## 2017-03-27 ENCOUNTER — Other Ambulatory Visit: Payer: Self-pay | Admitting: Urology

## 2017-03-27 DIAGNOSIS — C61 Malignant neoplasm of prostate: Secondary | ICD-10-CM

## 2017-04-09 ENCOUNTER — Encounter (HOSPITAL_COMMUNITY)
Admission: RE | Admit: 2017-04-09 | Discharge: 2017-04-09 | Disposition: A | Discharge status: Home / Self Care | Payer: PRIVATE HEALTH INSURANCE | Source: Ambulatory Visit | Attending: Urology | Admitting: Urology

## 2017-04-09 DIAGNOSIS — C775 Secondary and unspecified malignant neoplasm of intrapelvic lymph nodes: Secondary | ICD-10-CM | POA: Diagnosis not present

## 2017-04-09 DIAGNOSIS — C61 Malignant neoplasm of prostate: Secondary | ICD-10-CM | POA: Insufficient documentation

## 2017-04-09 MED ORDER — AXUMIN (FLUCICLOVINE F 18) INJECTION: 10.0000 | Freq: Once | INTRAVENOUS | Status: DC

## 2017-04-16 ENCOUNTER — Encounter: Payer: Self-pay | Admitting: Radiation Oncology

## 2017-05-01 NOTE — Progress Notes (Signed)
GU Location of Tumor / Histology:Recurrent carcinoma of the prostate  If Prostate Cancer, Gleason Score is ( 4+3= 7 ) and PSA is (0.93) 03-20-17   [name] presented in the spring of 2009 with a rising PSA which was concerning to him.Marland Kitchen  His PSA was 8.07 and he underwent ultrasound-guided biopsies by Dr. Gaynelle Arabian on 06/18/2007.  He was found have Gleason 8 (4+4) and also Gleason 7 (3+4) disease.  Gleason 8 was seen along the right apex and Gleason 7 was seen along the left base and left mid gland.  He was seen by Dr. Alinda Money who performed a laparoscopic robotic prostatectomy on 07/24/2007.  He was found have Gleason 7 (4+3) disease involving the right and left lobes.   Biopsies of Prostate (if applicable) revealed  2/92/4462     Past/Anticipated interventions by urology, if any:   12/29/13 PSA -0.33 Testosterone - 539, 08-11-12 361, 04-24-11 44.90, 04-26-09 332.0, 05-15-07  01-10-14 3.75 and Vitamin D - 43, He took Cialis and Trimix injections in the past.  He has used a vacuum pump which works well.   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 No  Pacemaker/ICD? No  Possible current pregnancy?N/A  Is the patient on methotrexate? No  Current Complaints / other details:    BP 133/63 (BP Location: Right Arm, Patient Position: Sitting, Cuff Size: Normal)   Pulse 65   Temp 98.9 F (37.2 C) (Oral)   Resp 20   Ht 5\' 11"  (1.803 m)   Wt 263 lb 12.8 oz (119.7 kg)   SpO2 97%   BMI 36.79 kg/m                        :

## 2017-05-02 ENCOUNTER — Other Ambulatory Visit: Payer: Self-pay

## 2017-05-02 ENCOUNTER — Ambulatory Visit
Admission: RE | Admit: 2017-05-02 | Discharge: 2017-05-02 | Disposition: A | Discharge status: Home / Self Care | Payer: PRIVATE HEALTH INSURANCE | Source: Ambulatory Visit | Attending: Urology | Admitting: Urology

## 2017-05-02 ENCOUNTER — Encounter: Payer: Self-pay | Admitting: Urology

## 2017-05-02 ENCOUNTER — Ambulatory Visit
Admission: RE | Admit: 2017-05-02 | Discharge: 2017-05-02 | Disposition: A | Discharge status: Home / Self Care | Payer: PRIVATE HEALTH INSURANCE | Source: Ambulatory Visit

## 2017-05-02 VITALS — BP 133/63 | HR 65 | Temp 98.9°F | Resp 20 | Ht 71.0 in | Wt 263.8 lb

## 2017-05-02 DIAGNOSIS — E7800 Pure hypercholesterolemia, unspecified: Secondary | ICD-10-CM | POA: Insufficient documentation

## 2017-05-02 DIAGNOSIS — Z7982 Long term (current) use of aspirin: Secondary | ICD-10-CM | POA: Diagnosis not present

## 2017-05-02 DIAGNOSIS — C61 Malignant neoplasm of prostate: Secondary | ICD-10-CM | POA: Diagnosis not present

## 2017-05-02 DIAGNOSIS — I10 Essential (primary) hypertension: Secondary | ICD-10-CM | POA: Diagnosis not present

## 2017-05-02 DIAGNOSIS — Z79899 Other long term (current) drug therapy: Secondary | ICD-10-CM | POA: Diagnosis not present

## 2017-05-02 DIAGNOSIS — Z87891 Personal history of nicotine dependence: Secondary | ICD-10-CM | POA: Diagnosis not present

## 2017-05-02 DIAGNOSIS — E119 Type 2 diabetes mellitus without complications: Secondary | ICD-10-CM | POA: Diagnosis not present

## 2017-05-02 NOTE — Progress Notes (Signed)
Radiation Oncology         [phone] ________________________________  Outpatient Follow-Up New   Name: [name] MRN: [id number]  Date: 05/02/2017  DOB: [birth year]  WU:[id number], Pcp Not In  Festus Aloe, MD   REFERRING PHYSICIAN: Festus Aloe, MD  DIAGNOSIS: The encounter diagnosis was Malignant neoplasm of prostate [facility]).    ICD-10-CM   1. Malignant neoplasm of prostate ([place]) C61     HISTORY OF PRESENT ILLNESS: [name] is a 71 y.o. male with a diagnosis of biochemically recurrent prostate cancer. He was originally diagnosed with a Gleason 4+4 prostate cancer in 2009 (PSA 8.07) s/p radical prostatectomy by Dr. Alinda Money on 07/24/2007. Pathology revealed pT2c, Gleason 4+3 adenocarcinoma of the prostate involving both left and right lobes with negative margins, negative ECE, negative SVI, and negative lymph nodes. Post-operative PSA remained undetectable until 2013 when it rose to 0.07.   03/2011: PSA 0.07 12/2013: PSA 0.33 08/2014: PSA 0.38  The patient saw Dr. Valere Dross on 10/13/2014 to discuss salvage radiation therapy but ultimately opted for active surveillance with serial PSAs which have continued to rise, with his most recent PSA in 02/2017 up to 0.93.  01/2015: PSA 0.68 04/2015: PSA 0.52 10/2015: PSA 0.53 02/2017: PSA 0.93  The patient's Axumin PET from 04/09/2017 did show a single 12 mm right external iliac node with SUV of 4.2. The patient has kindly been referred today for further discussion of salvage radiation treatment options.   PREVIOUS RADIATION THERAPY: No  PAST MEDICAL HISTORY:  Past Medical History:  Diagnosis Date  . Diabetes mellitus ([place])   . Erectile dysfunction   . Hypercholesteremia   . Hypertension   . Male stress incontinence   . Nonalcoholic fatty liver disease   . Osteopenia   . Prostate cancer ([place])   . Sleep apnea   . Vitamin D deficiency       PAST SURGICAL HISTORY: Past Surgical History:  Procedure Laterality Date    . cholecytectomy    . FOOT SURGERY    . History of colonoscopy    . hx of Prostatect Retropubic Radical w/ Nerve Sparing Laparoscopic    . Laparoscopy Repair of Initial Inguinal Hernia    . LIVER BIOPSY      FAMILY HISTORY:  Family History  Problem Relation Age of Onset  . Heart attack Father 51  . Diabetes Mellitus I Mother   . Hypertension Mother   . Stroke Mother     SOCIAL HISTORY:  Social History   Socioeconomic History  . Marital status: Married    Spouse name: Not on file  . Number of children: 3  . Years of education: Not on file  . Highest education level: Not on file  Social Needs  . Financial resource strain: Not on file  . Food insecurity - worry: Not on file  . Food insecurity - inability: Not on file  . Transportation needs - medical: Not on file  . Transportation needs - non-medical: Not on file  Occupational History  . Occupation: Special educational needs teacher  Tobacco Use  . Smoking status: Former Smoker    Packs/day: 1.00    Years: 20.00    Pack years: 20.00    Types: Cigarettes  . Smokeless tobacco: Never Used  . Tobacco comment: Quit 20 years ago  Substance and Sexual Activity  . Alcohol use: No    Alcohol/week: 0.0 oz  . Drug use: Not on file  . Sexual activity: Not on file  Other  Topics Concern  . Not on file  Social History Narrative  . Not on file    ALLERGIES: Tetracyclines & related  MEDICATIONS:  Current Outpatient Medications  Medication Sig Dispense Refill  . amLODipine (NORVASC) 10 MG tablet Take 10 mg by mouth daily.    Marland Kitchen aspirin 325 MG tablet Take 325 mg by mouth daily.    . calcium-vitamin D (OSCAL WITH D) 500-200 MG-UNIT per tablet Take 1 tablet by mouth. Actually taking 600/400    . cholecalciferol (VITAMIN D) 1000 UNITS tablet Take 1,000 Units by mouth daily.    . fexofenadine (ALLEGRA) 60 MG tablet Take 180 mg by mouth 2 (two) times daily.    Marland Kitchen lisinopril-hydrochlorothiazide (PRINZIDE,ZESTORETIC) 20-12.5 MG per tablet Take 1 tablet  by mouth daily.    . Multiple Vitamin (MULTIVITAMIN) tablet Take 1 tablet by mouth daily.    . niacin 500 MG tablet Take 500 mg by mouth at bedtime.    . Omega-3 Fatty Acids (FISH OIL) 1000 MG CAPS Take by mouth.    Marland Kitchen PROPRANOLOL HCL ER PO Take 80 mg by mouth once.    Marland Kitchen glucosamine-chondroitin 500-400 MG tablet Take 1 tablet by mouth 3 (three) times daily.    Marland Kitchen PRESCRIPTION MEDICATION Inject into the muscle 3 (three) times a week. Trimex as needed. Injectable     No current facility-administered medications for this encounter.     REVIEW OF SYSTEMS:  On review of systems, the patient reports that he is doing well overall. He denies any chest pain, shortness of breath, cough, fevers, chills, night sweats, or unintended weight changes. He denies any bowel disturbances, and denies abdominal pain, nausea or vomiting. He denies any new musculoskeletal or joint aches or pains. His IPSS was 3, indicating mild urinary symptoms with stress incontinence. He reports severe erectile dysfunction due to arterial insufficiency persistent since surgery, but this is not a priority for him. A complete review of systems is obtained and is otherwise negative.    PHYSICAL EXAM:  Wt Readings from Last 3 Encounters:  05/02/17 263 lb 12.8 oz (119.7 kg)  10/13/14 255 lb 3.2 oz (115.8 kg)   Temp Readings from Last 3 Encounters:  05/02/17 98.9 F (37.2 C) (Oral)  10/13/14 98.1 F (36.7 C)   BP Readings from Last 3 Encounters:  05/02/17 133/63  10/13/14 (!) 146/74   Pulse Readings from Last 3 Encounters:  05/02/17 65  10/13/14 76   Pain Assessment Pain Score: 0-No pain/10  In general this is a well appearing African-American male in no acute distress. He is alert and oriented x4 and appropriate throughout the examination. HEENT reveals that the patient is normocephalic, atraumatic. Skin is intact without any evidence of gross lesions. Cardiovascular exam reveals a regular rate and rhythm, no clicks rubs or  murmurs are auscultated. Chest is clear to auscultation bilaterally. Lymphatic assessment is performed and does not reveal any adenopathy in the supraclavicular, axillary, or inguinal chains. Abdomen has active bowel sounds in all quadrants and is intact. The abdomen is soft, non tender, non distended. Lower extremities are negative for pretibial pitting edema, deep calf tenderness, cyanosis or clubbing.  KPS = 90  100 - Normal; no complaints; no evidence of disease. 90   - Able to carry on normal activity; minor signs or symptoms of disease. 80   - Normal activity with effort; some signs or symptoms of disease. 97   - Cares for self; unable to carry on normal activity or to do  active work. 60   - Requires occasional assistance, but is able to care for most of his personal needs. 50   - Requires considerable assistance and frequent medical care. 79   - Disabled; requires special care and assistance. 56   - Severely disabled; hospital admission is indicated although death not imminent. 72   - Very sick; hospital admission necessary; active supportive treatment necessary. 10   - Moribund; fatal processes progressing rapidly. 0     - Dead  Karnofsky DA, Abelmann [place], Craver LS and Burchenal [facility] [phone]) The use of the nitrogen mustards in the palliative treatment of carcinoma: with particular reference to bronchogenic carcinoma Cancer 1 634-56  LABORATORY DATA:  Lab Results  Component Value Date   WBC 7.1 08/28/2008   HGB 14.8 08/28/2008   HCT 43.1 08/28/2008   MCV 88.9 08/28/2008   PLT 181 08/28/2008   Lab Results  Component Value Date   NA 140 08/28/2008   K 3.8 08/28/2008   CL 104 08/28/2008   CO2 30 08/28/2008   Lab Results  Component Value Date   ALT 44 08/28/2008   AST 27 08/28/2008   ALKPHOS 46 08/28/2008   BILITOT 0.8 08/28/2008     RADIOGRAPHY: Nm Pet (axumin) Skull Base To Mid Thigh  Result Date: 04/09/2017 CLINICAL DATA:  Prostate carcinoma with biochemical recurrence.  EXAM: NUCLEAR MEDICINE PET SKULL BASE TO THIGH TECHNIQUE: 10.0 mCi F-18 Fluciclovine was injected intravenously. Full-ring PET imaging was performed from the skull base to thigh after the radiotracer. CT data was obtained and used for attenuation correction and anatomic localization. COMPARISON:  None. FINDINGS: NECK No radiotracer activity in neck lymph nodes. CHEST No radiotracer accumulation within mediastinal or hilar lymph nodes. No suspicious pulmonary nodules on the CT scan. ABDOMEN/PELVIS No focal radiotracer activity within the prostate bed. A 12 mm right external iliac lymph node is seen on image 167/4, which has radiotracer uptake with SUV max of 4.2. No other pelvic lymph nodes show radiotracer uptake. No radiotracer activity in periaortic lymph nodes or other abdominal lymph nodes. Physiologic activity noted within the liver and pancreas. Prior cholecystectomy. Colonic diverticulosis, without evidence of diverticulitis. Prior prostatectomy. Aortic atherosclerosis. SKELETON No focal  activity to suggest skeletal metastasis. IMPRESSION: Single 12 mm right external iliac lymph node shows radiotracer uptake, consistent with lymph node metastasis. No other sites of recurrent or metastatic carcinoma identified. Electronically Signed   By: Earle Gell M.D.   On: 04/09/2017 16:43      IMPRESSION/PLAN: 1. 72 y.o. gentleman with biochemically recurrent prostate cancer s/p radical prostatectomy in May 2009 for pT2c Gleason 4+3 disease and pretreatment PSA of 8.07; now with post treatment PSA of 0.93 and evidence of lymph node metastasis on recent Axumin PET.  Today we reviewed the findings and workup thus far.  We discussed the natural history of prostate cancer.  We reviewed the PET scan with the patient and discussed treatment options with regards to metastatic disease in a solitary right external iliac lymph node. Accordingly, the patient is eligible for 5 fractions of SBRT to the lymph node alone or 7.5  weeks of external radiation to the prostatic fossa with a boost to the lymph node. We discussed the available radiation techniques, their associated risks and benefits, and the details of logistics and delivery.  We reviewed the anticipated acute and late sequelae associated with radiation in this setting. We also detailed the role of ADT in the treatment of biochemically recurrent prostate cancer and  outlined the associated side effects that could be expected with this therapy.  At the conclusion of our conversation, the patient would like to proceed with 7.5 weeks of external radiation therapy with a boost to the involved lymph node in combination with ST-ADT. We will contact Dr. Junious Silk to begin ADT in the near future followed by CT Simulation in anticipation of beginning radiotherapy after 5/92/92 to avoid conflicting with a planned vacation.   We spent more than 50% of today's time face to face with the patient in counseling and/or coordination of care.     Nicholos Johns, PA-C    Tyler Pita, MD  [facility] Direct Dial: [phone]  Fax: [phone] [email or web address]  Skype  LinkedIn  This document serves as a record of services personally performed by Tyler Pita, MD and Freeman Caldron, PA-C. It was created on their behalf by Rae Lips, a trained medical scribe. The creation of this record is based on the scribe's personal observations and the providers' statements to them. This document has been checked and approved by the attending providers.

## 2017-05-06 ENCOUNTER — Telehealth: Payer: Self-pay | Admitting: *Deleted

## 2017-05-06 NOTE — Telephone Encounter (Signed)
Called patient to inform of ADT for 05-09-17 @ 1 pm @ Dr. Lyndal Rainbow Office and his sim on 05-17-17 @ 10 am @ Dr. Johny Shears Office, lvm for a return call

## 2017-05-08 ENCOUNTER — Telehealth: Payer: Self-pay | Admitting: Urology

## 2017-05-08 NOTE — Telephone Encounter (Signed)
I tried to return the patient's call and had to leave a voicemail for Mr. Buenaventura to return my call if he has further questions that I can answer for him.   Nicholos Johns, PA-C

## 2017-05-16 ENCOUNTER — Telehealth: Payer: Self-pay | Admitting: Medical Oncology

## 2017-05-16 ENCOUNTER — Encounter: Payer: Self-pay | Admitting: Medical Oncology

## 2017-05-16 NOTE — Telephone Encounter (Signed)
Mr. Sundt called stating he was not aware of his CT simulation tomorrow. I asked if he received his ADT on 3/14 at [facility] and he voiced yes. I will call CT sim to notify them of the above and I will ask them to call him back to schedule. He voiced understanding. I emailed CT with this message.

## 2017-05-16 NOTE — Telephone Encounter (Signed)
Left message requesting a return call to confirm his CT simulation for 05/17/17. I asked if he would be willing to participate in an interview post simulation to share his patient experience.

## 2017-05-17 ENCOUNTER — Ambulatory Visit: Payer: Medicare Other | Admitting: Radiation Oncology

## 2017-05-24 ENCOUNTER — Ambulatory Visit
Admission: RE | Admit: 2017-05-24 | Discharge: 2017-05-24 | Disposition: A | Discharge status: Home / Self Care | Payer: PRIVATE HEALTH INSURANCE | Source: Ambulatory Visit | Attending: Radiation Oncology | Admitting: Radiation Oncology

## 2017-05-24 ENCOUNTER — Encounter: Payer: Self-pay | Admitting: Medical Oncology

## 2017-05-24 DIAGNOSIS — C61 Malignant neoplasm of prostate: Secondary | ICD-10-CM | POA: Insufficient documentation

## 2017-05-24 DIAGNOSIS — Z9079 Acquired absence of other genital organ(s): Secondary | ICD-10-CM | POA: Diagnosis not present

## 2017-05-24 NOTE — Progress Notes (Signed)
  Radiation Oncology         [phone] ________________________________  Name: [name] MRN: [id number]  Date: 05/24/2017  DOB: [birth year]  SIMULATION AND TREATMENT PLANNING NOTE    ICD-10-CM   1. Malignant neoplasm of prostate ([place]) C61     DIAGNOSIS:  72 y.o. gentleman with biochemically recurrent prostate cancer s/p radical prostatectomy in May 2009 for pT2c Gleason 4+3 disease and pretreatment PSA of 8.07; now with post treatment PSA of 0.93 and evidence of lymph node metastasis on recent Axumin PET  NARRATIVE:  The patient was brought to the [facility].  Identity was confirmed.  All relevant records and images related to the planned course of therapy were reviewed.  The patient freely provided informed written consent to proceed with treatment after reviewing the details related to the planned course of therapy. The consent form was witnessed and verified by the simulation staff.  Then, the patient was set-up in a stable reproducible supine position for radiation therapy.  A vacuum lock pillow device was custom fabricated to position his legs in a reproducible immobilized position.  Then, I performed a urethrogram under sterile conditions to identify the prostatic bed.  CT images were obtained.  Surface markings were placed.  The CT images were loaded into the planning software.  Then the prostate bed target, pelvic lymph node target and avoidance structures including the rectum, bladder, bowel and hips were contoured.  Treatment planning then occurred.  The radiation prescription was entered and confirmed.  A total of one complex treatment devices were fabricated. I have requested : Intensity Modulated Radiotherapy (IMRT) is medically necessary for this case for the following reason:  Rectal sparing.Marland Kitchen  PLAN:  The patient will receive 45 Gy in 25 fractions of 1.8 Gy, followed by a boost to the prostate bed and node to a total dose of 68.4 Gy with 13 additional  fractions of 1.8 Gy.   ________________________________  Sheral Apley Tammi Klippel, M.D.   This document serves as a record of services personally performed by Tyler Pita, MD. It was created on his behalf by Arlyce Harman, a trained medical scribe. The creation of this record is based on the scribe's personal observations and the provider's statements to them. This document has been checked and approved by the attending provider.

## 2017-06-03 ENCOUNTER — Ambulatory Visit
Admission: RE | Admit: 2017-06-03 | Discharge: 2017-06-03 | Disposition: A | Discharge status: Home / Self Care | Payer: PRIVATE HEALTH INSURANCE | Source: Ambulatory Visit | Attending: Radiation Oncology | Admitting: Radiation Oncology

## 2017-06-03 DIAGNOSIS — Z510 Encounter for antineoplastic radiation therapy: Secondary | ICD-10-CM | POA: Insufficient documentation

## 2017-06-03 DIAGNOSIS — C61 Malignant neoplasm of prostate: Secondary | ICD-10-CM | POA: Insufficient documentation

## 2017-06-04 ENCOUNTER — Ambulatory Visit
Admission: RE | Admit: 2017-06-04 | Discharge: 2017-06-04 | Disposition: A | Discharge status: Home / Self Care | Payer: PRIVATE HEALTH INSURANCE | Source: Ambulatory Visit | Attending: Radiation Oncology | Admitting: Radiation Oncology

## 2017-06-04 DIAGNOSIS — Z510 Encounter for antineoplastic radiation therapy: Secondary | ICD-10-CM | POA: Diagnosis not present

## 2017-06-05 ENCOUNTER — Ambulatory Visit
Admission: RE | Admit: 2017-06-05 | Discharge: 2017-06-05 | Disposition: A | Discharge status: Home / Self Care | Payer: PRIVATE HEALTH INSURANCE | Source: Ambulatory Visit | Attending: Radiation Oncology | Admitting: Radiation Oncology

## 2017-06-05 DIAGNOSIS — Z510 Encounter for antineoplastic radiation therapy: Secondary | ICD-10-CM | POA: Diagnosis not present

## 2017-06-06 ENCOUNTER — Ambulatory Visit
Admission: RE | Admit: 2017-06-06 | Discharge: 2017-06-06 | Disposition: A | Discharge status: Home / Self Care | Payer: PRIVATE HEALTH INSURANCE | Source: Ambulatory Visit | Attending: Radiation Oncology | Admitting: Radiation Oncology

## 2017-06-06 DIAGNOSIS — Z510 Encounter for antineoplastic radiation therapy: Secondary | ICD-10-CM | POA: Diagnosis not present

## 2017-06-07 ENCOUNTER — Ambulatory Visit
Admission: RE | Admit: 2017-06-07 | Discharge: 2017-06-07 | Disposition: A | Discharge status: Home / Self Care | Payer: PRIVATE HEALTH INSURANCE | Source: Ambulatory Visit | Attending: Radiation Oncology | Admitting: Radiation Oncology

## 2017-06-07 DIAGNOSIS — Z510 Encounter for antineoplastic radiation therapy: Secondary | ICD-10-CM | POA: Diagnosis not present

## 2017-06-10 ENCOUNTER — Ambulatory Visit
Admission: RE | Admit: 2017-06-10 | Discharge: 2017-06-10 | Disposition: A | Discharge status: Home / Self Care | Payer: PRIVATE HEALTH INSURANCE | Source: Ambulatory Visit | Attending: Radiation Oncology | Admitting: Radiation Oncology

## 2017-06-10 DIAGNOSIS — Z510 Encounter for antineoplastic radiation therapy: Secondary | ICD-10-CM | POA: Diagnosis not present

## 2017-06-11 ENCOUNTER — Telehealth: Payer: Self-pay | Admitting: Radiation Oncology

## 2017-06-11 ENCOUNTER — Ambulatory Visit
Admission: RE | Admit: 2017-06-11 | Discharge: 2017-06-11 | Disposition: A | Discharge status: Home / Self Care | Payer: PRIVATE HEALTH INSURANCE | Source: Ambulatory Visit | Attending: Radiation Oncology | Admitting: Radiation Oncology

## 2017-06-11 ENCOUNTER — Encounter: Payer: Self-pay | Admitting: Medical Oncology

## 2017-06-11 DIAGNOSIS — Z510 Encounter for antineoplastic radiation therapy: Secondary | ICD-10-CM | POA: Diagnosis not present

## 2017-06-11 NOTE — Telephone Encounter (Signed)
Received voicemail message from patient's wife requesting return call. Phoned [name]. She inquired about the appropriate diet for her husband while he receives radiation therapy. Instructed upon a low residue diet. Also, place a LOW RESIDE DIET handout on L4 for patient to pick up tomorrow. Wife verbalized understanding of all reviewed and appreciation for the return call.

## 2017-06-12 ENCOUNTER — Ambulatory Visit
Admission: RE | Admit: 2017-06-12 | Discharge: 2017-06-12 | Disposition: A | Discharge status: Home / Self Care | Payer: PRIVATE HEALTH INSURANCE | Source: Ambulatory Visit | Attending: Radiation Oncology | Admitting: Radiation Oncology

## 2017-06-12 DIAGNOSIS — Z510 Encounter for antineoplastic radiation therapy: Secondary | ICD-10-CM | POA: Diagnosis not present

## 2017-06-13 ENCOUNTER — Ambulatory Visit
Admission: RE | Admit: 2017-06-13 | Discharge: 2017-06-13 | Disposition: A | Discharge status: Home / Self Care | Payer: PRIVATE HEALTH INSURANCE | Source: Ambulatory Visit | Attending: Radiation Oncology | Admitting: Radiation Oncology

## 2017-06-13 DIAGNOSIS — Z510 Encounter for antineoplastic radiation therapy: Secondary | ICD-10-CM | POA: Diagnosis not present

## 2017-06-14 ENCOUNTER — Ambulatory Visit
Admission: RE | Admit: 2017-06-14 | Discharge: 2017-06-14 | Disposition: A | Discharge status: Home / Self Care | Payer: PRIVATE HEALTH INSURANCE | Source: Ambulatory Visit | Attending: Radiation Oncology | Admitting: Radiation Oncology

## 2017-06-14 DIAGNOSIS — Z510 Encounter for antineoplastic radiation therapy: Secondary | ICD-10-CM | POA: Diagnosis not present

## 2017-06-17 ENCOUNTER — Ambulatory Visit
Admission: RE | Admit: 2017-06-17 | Discharge: 2017-06-17 | Disposition: A | Discharge status: Home / Self Care | Payer: PRIVATE HEALTH INSURANCE | Source: Ambulatory Visit | Attending: Radiation Oncology | Admitting: Radiation Oncology

## 2017-06-17 DIAGNOSIS — Z510 Encounter for antineoplastic radiation therapy: Secondary | ICD-10-CM | POA: Diagnosis not present

## 2017-06-18 ENCOUNTER — Ambulatory Visit
Admission: RE | Admit: 2017-06-18 | Discharge: 2017-06-18 | Disposition: A | Discharge status: Home / Self Care | Payer: PRIVATE HEALTH INSURANCE | Source: Ambulatory Visit | Attending: Radiation Oncology | Admitting: Radiation Oncology

## 2017-06-18 DIAGNOSIS — Z510 Encounter for antineoplastic radiation therapy: Secondary | ICD-10-CM | POA: Diagnosis not present

## 2017-06-19 ENCOUNTER — Ambulatory Visit
Admission: RE | Admit: 2017-06-19 | Discharge: 2017-06-19 | Disposition: A | Discharge status: Home / Self Care | Payer: PRIVATE HEALTH INSURANCE | Source: Ambulatory Visit | Attending: Radiation Oncology | Admitting: Radiation Oncology

## 2017-06-19 DIAGNOSIS — Z510 Encounter for antineoplastic radiation therapy: Secondary | ICD-10-CM | POA: Diagnosis not present

## 2017-06-20 ENCOUNTER — Ambulatory Visit
Admission: RE | Admit: 2017-06-20 | Discharge: 2017-06-20 | Disposition: A | Discharge status: Home / Self Care | Payer: PRIVATE HEALTH INSURANCE | Source: Ambulatory Visit | Attending: Radiation Oncology | Admitting: Radiation Oncology

## 2017-06-20 DIAGNOSIS — Z510 Encounter for antineoplastic radiation therapy: Secondary | ICD-10-CM | POA: Diagnosis not present

## 2017-06-21 ENCOUNTER — Ambulatory Visit
Admission: RE | Admit: 2017-06-21 | Discharge: 2017-06-21 | Disposition: A | Discharge status: Home / Self Care | Payer: PRIVATE HEALTH INSURANCE | Source: Ambulatory Visit | Attending: Radiation Oncology | Admitting: Radiation Oncology

## 2017-06-21 DIAGNOSIS — Z510 Encounter for antineoplastic radiation therapy: Secondary | ICD-10-CM | POA: Diagnosis not present

## 2017-06-24 ENCOUNTER — Ambulatory Visit
Admission: RE | Admit: 2017-06-24 | Discharge: 2017-06-24 | Disposition: A | Discharge status: Home / Self Care | Payer: PRIVATE HEALTH INSURANCE | Source: Ambulatory Visit | Attending: Radiation Oncology | Admitting: Radiation Oncology

## 2017-06-24 DIAGNOSIS — Z510 Encounter for antineoplastic radiation therapy: Secondary | ICD-10-CM | POA: Diagnosis not present

## 2017-06-25 ENCOUNTER — Ambulatory Visit
Admission: RE | Admit: 2017-06-25 | Discharge: 2017-06-25 | Disposition: A | Discharge status: Home / Self Care | Payer: PRIVATE HEALTH INSURANCE | Source: Ambulatory Visit | Attending: Radiation Oncology | Admitting: Radiation Oncology

## 2017-06-25 DIAGNOSIS — Z510 Encounter for antineoplastic radiation therapy: Secondary | ICD-10-CM | POA: Diagnosis not present

## 2017-06-26 ENCOUNTER — Ambulatory Visit
Admission: RE | Admit: 2017-06-26 | Discharge: 2017-06-26 | Disposition: A | Discharge status: Home / Self Care | Payer: Medicare Other | Source: Ambulatory Visit | Attending: Radiation Oncology | Admitting: Radiation Oncology

## 2017-06-26 DIAGNOSIS — C61 Malignant neoplasm of prostate: Secondary | ICD-10-CM | POA: Insufficient documentation

## 2017-06-26 DIAGNOSIS — Z510 Encounter for antineoplastic radiation therapy: Secondary | ICD-10-CM | POA: Insufficient documentation

## 2017-06-27 ENCOUNTER — Ambulatory Visit
Admission: RE | Admit: 2017-06-27 | Discharge: 2017-06-27 | Disposition: A | Discharge status: Home / Self Care | Payer: Medicare Other | Source: Ambulatory Visit | Attending: Radiation Oncology | Admitting: Radiation Oncology

## 2017-06-27 DIAGNOSIS — Z510 Encounter for antineoplastic radiation therapy: Secondary | ICD-10-CM | POA: Diagnosis not present

## 2017-06-28 ENCOUNTER — Ambulatory Visit
Admission: RE | Admit: 2017-06-28 | Discharge: 2017-06-28 | Disposition: A | Discharge status: Home / Self Care | Payer: Medicare Other | Source: Ambulatory Visit | Attending: Radiation Oncology | Admitting: Radiation Oncology

## 2017-06-28 ENCOUNTER — Encounter: Payer: Self-pay | Admitting: Medical Oncology

## 2017-06-28 DIAGNOSIS — Z510 Encounter for antineoplastic radiation therapy: Secondary | ICD-10-CM | POA: Diagnosis not present

## 2017-07-01 ENCOUNTER — Ambulatory Visit
Admission: RE | Admit: 2017-07-01 | Discharge: 2017-07-01 | Disposition: A | Discharge status: Home / Self Care | Payer: Medicare Other | Source: Ambulatory Visit | Attending: Radiation Oncology | Admitting: Radiation Oncology

## 2017-07-01 DIAGNOSIS — Z510 Encounter for antineoplastic radiation therapy: Secondary | ICD-10-CM | POA: Diagnosis not present

## 2017-07-02 ENCOUNTER — Ambulatory Visit
Admission: RE | Admit: 2017-07-02 | Discharge: 2017-07-02 | Disposition: A | Discharge status: Home / Self Care | Payer: Medicare Other | Source: Ambulatory Visit | Attending: Radiation Oncology | Admitting: Radiation Oncology

## 2017-07-02 DIAGNOSIS — Z510 Encounter for antineoplastic radiation therapy: Secondary | ICD-10-CM | POA: Diagnosis not present

## 2017-07-03 ENCOUNTER — Ambulatory Visit
Admission: RE | Admit: 2017-07-03 | Discharge: 2017-07-03 | Disposition: A | Discharge status: Home / Self Care | Payer: Medicare Other | Source: Ambulatory Visit | Attending: Radiation Oncology | Admitting: Radiation Oncology

## 2017-07-03 DIAGNOSIS — Z510 Encounter for antineoplastic radiation therapy: Secondary | ICD-10-CM | POA: Diagnosis not present

## 2017-07-04 ENCOUNTER — Ambulatory Visit
Admission: RE | Admit: 2017-07-04 | Discharge: 2017-07-04 | Disposition: A | Discharge status: Home / Self Care | Payer: Medicare Other | Source: Ambulatory Visit | Attending: Radiation Oncology | Admitting: Radiation Oncology

## 2017-07-04 DIAGNOSIS — Z510 Encounter for antineoplastic radiation therapy: Secondary | ICD-10-CM | POA: Diagnosis not present

## 2017-07-05 ENCOUNTER — Ambulatory Visit
Admission: RE | Admit: 2017-07-05 | Discharge: 2017-07-05 | Disposition: A | Discharge status: Home / Self Care | Payer: Medicare Other | Source: Ambulatory Visit | Attending: Radiation Oncology | Admitting: Radiation Oncology

## 2017-07-05 DIAGNOSIS — Z510 Encounter for antineoplastic radiation therapy: Secondary | ICD-10-CM | POA: Diagnosis not present

## 2017-07-08 ENCOUNTER — Ambulatory Visit
Admission: RE | Admit: 2017-07-08 | Discharge: 2017-07-08 | Disposition: A | Discharge status: Home / Self Care | Payer: Medicare Other | Source: Ambulatory Visit | Attending: Radiation Oncology | Admitting: Radiation Oncology

## 2017-07-08 DIAGNOSIS — Z510 Encounter for antineoplastic radiation therapy: Secondary | ICD-10-CM | POA: Diagnosis not present

## 2017-07-09 ENCOUNTER — Ambulatory Visit
Admission: RE | Admit: 2017-07-09 | Discharge: 2017-07-09 | Disposition: A | Discharge status: Home / Self Care | Payer: Medicare Other | Source: Ambulatory Visit | Attending: Radiation Oncology | Admitting: Radiation Oncology

## 2017-07-09 DIAGNOSIS — Z510 Encounter for antineoplastic radiation therapy: Secondary | ICD-10-CM | POA: Diagnosis not present

## 2017-07-10 ENCOUNTER — Ambulatory Visit
Admission: RE | Admit: 2017-07-10 | Discharge: 2017-07-10 | Disposition: A | Discharge status: Home / Self Care | Payer: Medicare Other | Source: Ambulatory Visit | Attending: Radiation Oncology | Admitting: Radiation Oncology

## 2017-07-10 DIAGNOSIS — Z510 Encounter for antineoplastic radiation therapy: Secondary | ICD-10-CM | POA: Diagnosis not present

## 2017-07-11 ENCOUNTER — Ambulatory Visit
Admission: RE | Admit: 2017-07-11 | Discharge: 2017-07-11 | Disposition: A | Discharge status: Home / Self Care | Payer: Medicare Other | Source: Ambulatory Visit | Attending: Radiation Oncology | Admitting: Radiation Oncology

## 2017-07-11 DIAGNOSIS — Z510 Encounter for antineoplastic radiation therapy: Secondary | ICD-10-CM | POA: Diagnosis not present

## 2017-07-12 ENCOUNTER — Ambulatory Visit
Admission: RE | Admit: 2017-07-12 | Discharge: 2017-07-12 | Disposition: A | Discharge status: Home / Self Care | Payer: Medicare Other | Source: Ambulatory Visit | Attending: Radiation Oncology | Admitting: Radiation Oncology

## 2017-07-12 DIAGNOSIS — Z510 Encounter for antineoplastic radiation therapy: Secondary | ICD-10-CM | POA: Diagnosis not present

## 2017-07-15 ENCOUNTER — Ambulatory Visit
Admission: RE | Admit: 2017-07-15 | Discharge: 2017-07-15 | Disposition: A | Discharge status: Home / Self Care | Payer: Medicare Other | Source: Ambulatory Visit | Attending: Radiation Oncology | Admitting: Radiation Oncology

## 2017-07-15 DIAGNOSIS — Z510 Encounter for antineoplastic radiation therapy: Secondary | ICD-10-CM | POA: Diagnosis not present

## 2017-07-16 ENCOUNTER — Ambulatory Visit
Admission: RE | Admit: 2017-07-16 | Discharge: 2017-07-16 | Disposition: A | Discharge status: Home / Self Care | Payer: Medicare Other | Source: Ambulatory Visit | Attending: Radiation Oncology | Admitting: Radiation Oncology

## 2017-07-16 DIAGNOSIS — Z510 Encounter for antineoplastic radiation therapy: Secondary | ICD-10-CM | POA: Diagnosis not present

## 2017-07-17 ENCOUNTER — Ambulatory Visit
Admission: RE | Admit: 2017-07-17 | Discharge: 2017-07-17 | Disposition: A | Discharge status: Home / Self Care | Payer: Medicare Other | Source: Ambulatory Visit | Attending: Radiation Oncology | Admitting: Radiation Oncology

## 2017-07-17 DIAGNOSIS — Z510 Encounter for antineoplastic radiation therapy: Secondary | ICD-10-CM | POA: Diagnosis not present

## 2017-07-18 ENCOUNTER — Ambulatory Visit
Admission: RE | Admit: 2017-07-18 | Discharge: 2017-07-18 | Disposition: A | Discharge status: Home / Self Care | Payer: Medicare Other | Source: Ambulatory Visit | Attending: Radiation Oncology | Admitting: Radiation Oncology

## 2017-07-18 DIAGNOSIS — Z510 Encounter for antineoplastic radiation therapy: Secondary | ICD-10-CM | POA: Diagnosis not present

## 2017-07-19 ENCOUNTER — Ambulatory Visit
Admission: RE | Admit: 2017-07-19 | Discharge: 2017-07-19 | Disposition: A | Discharge status: Home / Self Care | Payer: Medicare Other | Source: Ambulatory Visit | Attending: Radiation Oncology | Admitting: Radiation Oncology

## 2017-07-19 DIAGNOSIS — Z510 Encounter for antineoplastic radiation therapy: Secondary | ICD-10-CM | POA: Diagnosis not present

## 2017-07-23 ENCOUNTER — Ambulatory Visit
Admission: RE | Admit: 2017-07-23 | Discharge: 2017-07-23 | Disposition: A | Discharge status: Home / Self Care | Payer: Medicare Other | Source: Ambulatory Visit | Attending: Radiation Oncology | Admitting: Radiation Oncology

## 2017-07-23 DIAGNOSIS — Z510 Encounter for antineoplastic radiation therapy: Secondary | ICD-10-CM | POA: Diagnosis not present

## 2017-07-24 ENCOUNTER — Telehealth: Payer: Self-pay | Admitting: *Deleted

## 2017-07-24 ENCOUNTER — Ambulatory Visit
Admission: RE | Admit: 2017-07-24 | Discharge: 2017-07-24 | Disposition: A | Discharge status: Home / Self Care | Payer: Medicare Other | Source: Ambulatory Visit | Attending: Radiation Oncology | Admitting: Radiation Oncology

## 2017-07-24 DIAGNOSIS — Z510 Encounter for antineoplastic radiation therapy: Secondary | ICD-10-CM | POA: Diagnosis not present

## 2017-07-24 NOTE — Telephone Encounter (Signed)
xxxx 

## 2017-07-24 NOTE — Telephone Encounter (Signed)
Called patient to inform of fu with Dr. Junious Silk on 09-10-17- arrival time - 8:30 am, mailed patient an appt. card, per patient request

## 2017-07-25 ENCOUNTER — Ambulatory Visit
Admission: RE | Admit: 2017-07-25 | Discharge: 2017-07-25 | Disposition: A | Discharge status: Home / Self Care | Payer: Medicare Other | Source: Ambulatory Visit | Attending: Radiation Oncology | Admitting: Radiation Oncology

## 2017-07-25 DIAGNOSIS — Z510 Encounter for antineoplastic radiation therapy: Secondary | ICD-10-CM | POA: Diagnosis not present

## 2017-07-29 ENCOUNTER — Encounter: Payer: Self-pay | Admitting: Radiation Oncology

## 2017-07-29 NOTE — Progress Notes (Signed)
  Radiation Oncology         [phone] ________________________________  Name: [name] MRN: [id number]  Date: 07/29/2017  DOB: [birth year]  End of Treatment Note  Diagnosis:    72 y.o. gentleman with biochemically recurrent prostate cancer s/p radical prostatectomy in May 2009 for pT2c Gleason 4+3 disease and pretreatment PSA of 8.07; now with post treatment PSA of 0.93 and evidence of lymph node metastasis on recent Axumin PET  Indication for treatment:  Curative, Salvage Radiotherapy      Radiation treatment dates:  06/03/17 - 07/25/17  Site/dose:   The prostatic fossa was treated to 45 Gy in 25 fractions of 1.8 Gy followed by a boost of 23.4 Gy in 13 fx of 1.8 Gy to the prostatic fossa and involved node  Beams/energy:   The patient was treated with IMRT using volumetric arc therapy delivering 6 MV X-rays to clockwise and counterclockwise circumferential arcs with a 90 degree collimator offset to avoid dose scalloping.  Image guidance was performed with daily cone beam CT prior to each fraction to align to gold markers in the prostate and assure proper bladder and rectal fill volumes.  Immobilization was achieved with BodyFix custom mold.  Narrative: The patient tolerated radiation treatment relatively well.   He experienced some minor urinary irritation and modest fatigue. He denied gross hematuria, dysuria, weak stream, incomplete emptying or incontinence.  Plan: The patient has completed radiation treatment. He will return to radiation oncology clinic for routine followup in one month. I advised him to call or return sooner if he has any questions or concerns related to his recovery or treatment. ________________________________  Sheral Apley. Tammi Klippel, M.D.  This document serves as a record of services personally performed by Tyler Pita, MD. It was created on his behalf by Linward Natal, a trained medical scribe. The creation of this record is based on the scribe's personal  observations and the provider's statements to them. This document has been checked and approved by the attending provider.

## 2017-08-22 ENCOUNTER — Ambulatory Visit
Admission: RE | Admit: 2017-08-22 | Discharge: 2017-08-22 | Disposition: A | Discharge status: Home / Self Care | Payer: Medicare Other | Source: Ambulatory Visit | Attending: Urology | Admitting: Urology

## 2017-08-22 ENCOUNTER — Other Ambulatory Visit: Payer: Self-pay

## 2017-08-22 ENCOUNTER — Encounter: Payer: Self-pay | Admitting: Urology

## 2017-08-22 VITALS — BP 138/68 | HR 68 | Temp 98.5°F | Resp 18 | Ht 71.0 in | Wt 248.0 lb

## 2017-08-22 DIAGNOSIS — C779 Secondary and unspecified malignant neoplasm of lymph node, unspecified: Secondary | ICD-10-CM | POA: Insufficient documentation

## 2017-08-22 DIAGNOSIS — Z923 Personal history of irradiation: Secondary | ICD-10-CM | POA: Diagnosis not present

## 2017-08-22 DIAGNOSIS — C61 Malignant neoplasm of prostate: Secondary | ICD-10-CM | POA: Diagnosis present

## 2017-08-22 DIAGNOSIS — Z7982 Long term (current) use of aspirin: Secondary | ICD-10-CM | POA: Diagnosis not present

## 2017-08-22 DIAGNOSIS — Z79899 Other long term (current) drug therapy: Secondary | ICD-10-CM | POA: Diagnosis not present

## 2017-08-22 NOTE — Addendum Note (Signed)
Encounter addended by: Malena Edman, RN on: 08/22/2017 10:49 AM  Actions taken: Charge Capture section accepted

## 2017-08-22 NOTE — Progress Notes (Signed)
Radiation Oncology         [phone] ________________________________  Name: AHMIR BRACKEN MRN: [id number]  Date: 08/22/2017  DOB: 18-May-1945  Post Treatment Note  CC: System, Pcp Not In  Festus Aloe, MD  Diagnosis:   72 y.o. gentleman with biochemically recurrent prostate cancer s/p radical prostatectomy in May 2009 for pT2c Gleason 4+3 disease and pretreatment PSA of 8.07; now with post treatment PSA of 0.93 and evidence of lymph node metastasis on recent Axumin PET  Interval Since Last Radiation:  4 weeks  06/03/17 - 07/25/17:  The prostatic fossa was treated to 45 Gy in 25 fractions of 1.8 Gy followed by a boost of 23.4 Gy in 13 fx of 1.8 Gy to the prostatic fossa and involved node  Narrative:  The patient returns today for routine follow-up.  He tolerated radiation treatment relatively well and remained on ADT throughout his course of treatment.   He experienced some minor urinary irritation and modest fatigue. He denied gross hematuria, dysuria, weak stream, incomplete emptying or incontinence.  On review of systems, the patient states that he is doing very well overall.  He reports that his lower urinary tract symptoms have returned to his baseline his current IPSS score is 3 indicating mild urinary symptoms only.  He has continued with loose bowel movements and intermittent bouts of diarrhea but feels that this is gradually improving as well.  He also reports having issues with chronic hemorrhoids which are now inflamed.  He denies hematochezia, hematuria, dysuria, excessive daytime frequency, urgency, weak stream, straining to void, incomplete bladder emptying or incontinence.  He reports nocturia x2 per night which is normal for him.  He denies abdominal pain, nausea or vomiting and reports a healthy appetite.  He continues to tolerate his ADT relatively well aside from moderate fatigue and occasional hot flashes.  ALLERGIES:  is allergic to tetracyclines &  related.  Meds: Current Outpatient Medications  Medication Sig Dispense Refill  . amLODipine (NORVASC) 10 MG tablet Take 10 mg by mouth daily.    Marland Kitchen aspirin 325 MG tablet Take 325 mg by mouth daily.    . calcium-vitamin D (OSCAL WITH D) 500-200 MG-UNIT per tablet Take 1 tablet by mouth. Actually taking 600/400    . cholecalciferol (VITAMIN D) 1000 UNITS tablet Take 1,000 Units by mouth daily.    . fexofenadine (ALLEGRA) 60 MG tablet Take 180 mg by mouth 2 (two) times daily.    Marland Kitchen glucosamine-chondroitin 500-400 MG tablet Take 1 tablet by mouth 3 (three) times daily.    Marland Kitchen lisinopril-hydrochlorothiazide (PRINZIDE,ZESTORETIC) 20-12.5 MG per tablet Take 1 tablet by mouth daily.    . Multiple Vitamin (MULTIVITAMIN) tablet Take 1 tablet by mouth daily.    . niacin 500 MG tablet Take 500 mg by mouth at bedtime.    . Omega-3 Fatty Acids (FISH OIL) 1000 MG CAPS Take by mouth.    Marland Kitchen PROPRANOLOL HCL ER PO Take 80 mg by mouth once.    Marland Kitchen PRESCRIPTION MEDICATION Inject into the muscle 3 (three) times a week. Trimex as needed. Injectable     No current facility-administered medications for this encounter.     Physical Findings:  height is 5\' 11"  (1.803 m) and weight is 248 lb (112.5 kg). His oral temperature is 98.5 F (36.9 C). His blood pressure is 138/68 and his pulse is 68. His respiration is 18 and oxygen saturation is 99%.  Pain Assessment Pain Score: 0-No pain/10 In general this is a well  appearing African-American male in no acute distress.  He's alert and oriented x4 and appropriate throughout the examination. Cardiopulmonary assessment is negative for acute distress and he exhibits normal effort.   Lab Findings: Lab Results  Component Value Date   WBC 7.1 08/28/2008   HGB 14.8 08/28/2008   HCT 43.1 08/28/2008   MCV 88.9 08/28/2008   PLT 181 08/28/2008     Radiographic Findings: No results found.  Impression/Plan: 1. 72 y.o. gentleman with biochemically recurrent prostate cancer s/p  radical prostatectomy in May 2009 for pT2c Gleason 4+3 disease and pretreatment PSA of 8.07; now with post treatment PSA of 0.93 and evidence of lymph node metastasis on recent Axumin PET. He will continue to follow up with urology for ongoing PSA determinations and has an appointment scheduled with Dr. Junious Silk on 09/10/17. He understands what to expect with regards to PSA monitoring going forward. I will look forward to following his response to treatment via correspondence with urology, and would be happy to continue to participate in his care if clinically indicated. I talked to the patient about what to expect in the future, including his risk for erectile dysfunction and rectal bleeding. Regarding his inflamed hemorrhoids, I encouraged him to try OTC Tucks medicated pads or Preparation H ointment for symptomatic relief.  If his symptoms persist or worsen despite treatment, he will follow up with his gastroenterologist, Dr. Juanita Craver.  I encouraged him to call or return to the office if he has any questions regarding his previous radiation or possible radiation side effects. He was comfortable with this plan and will follow up as needed.     Nicholos Johns, PA-C

## 2017-08-23 ENCOUNTER — Ambulatory Visit: Payer: Self-pay | Admitting: Urology

## 2017-10-08 ENCOUNTER — Other Ambulatory Visit: Payer: Self-pay | Admitting: Gastroenterology

## 2017-10-08 DIAGNOSIS — K6289 Other specified diseases of anus and rectum: Secondary | ICD-10-CM

## 2017-10-17 ENCOUNTER — Other Ambulatory Visit: Payer: Self-pay | Admitting: Gastroenterology

## 2017-10-17 ENCOUNTER — Ambulatory Visit
Admission: RE | Admit: 2017-10-17 | Discharge: 2017-10-17 | Disposition: A | Discharge status: Home / Self Care | Payer: PRIVATE HEALTH INSURANCE | Source: Ambulatory Visit | Attending: Gastroenterology | Admitting: Gastroenterology

## 2017-10-17 DIAGNOSIS — K6289 Other specified diseases of anus and rectum: Secondary | ICD-10-CM

## 2018-02-20 DIAGNOSIS — K601 Chronic anal fissure: Secondary | ICD-10-CM | POA: Insufficient documentation

## 2019-05-28 ENCOUNTER — Ambulatory Visit: Payer: Self-pay

## 2019-05-28 ENCOUNTER — Ambulatory Visit (INDEPENDENT_AMBULATORY_CARE_PROVIDER_SITE_OTHER): Payer: PRIVATE HEALTH INSURANCE | Admitting: Family Medicine

## 2019-05-28 ENCOUNTER — Encounter: Payer: Self-pay | Admitting: Family Medicine

## 2019-05-28 ENCOUNTER — Other Ambulatory Visit: Payer: Self-pay

## 2019-05-28 DIAGNOSIS — M79605 Pain in left leg: Secondary | ICD-10-CM

## 2019-05-28 MED ORDER — PREDNISONE 10 MG PO TABS: ORAL_TABLET | ORAL | 0 refills | Status: DC

## 2019-05-28 NOTE — Progress Notes (Signed)
Office Visit Note   Patient: [name]           Date of Birth: 04-Feb-1946           MRN: [id number] Visit Date: 05/28/2019 Requested by: No referring provider defined for this encounter. PCP: System, Pcp Not In  Subjective: Chief Complaint  Patient presents with  . Left Leg - Pain    Pain in the left thigh mainly, but some pain in the left lower back. Started 3 weeks ago. NKI. Patient has noticed he leans to the right normally, and when she tries to straighten up the pain shoots down his thigh. No numbness/tingling currently.    HPI: He is here with left leg pain.  Symptoms started about 3 weeks ago, no injury.  He woke up 1 day with pain in the anterior lateral thigh.  Very painful to bear weight sometimes, if he waits for a minute then it is easier to walk.  He bought some crutches that he uses occasionally.  Now he is having some pain on the posterior hip as well.  No significant back pain, no pain radiating below the knee and no numbness or weakness in his leg.  Denies any bowel or bladder dysfunction, fevers or chills.  He has a history of prostate cancer treated surgically 10 years ago.  His PSA was rising a couple years ago and he was treated with radiation.  He has not had his PSA checked in about a year due to COVID-19.              ROS:   All other systems were reviewed and are negative.  Objective: Vital Signs: There were no vitals taken for this visit.  Physical Exam:  General:  Alert and oriented, in no acute distress. Pulm:  Breathing unlabored. Psy:  Normal mood, congruent affect. Skin: No rash Low back: He has no tenderness to palpation of midline lumbar spine.  He does have some tenderness in the gluteus medius area.  Negative straight leg raise, lower extremity strength and reflexes are normal. Left hip: Substantial pain and decreased range of motion with passive flexion and internal rotation.  Slight tenderness over the greater  trochanter.  Imaging: X-rays lumbar spine: No sign of compression fracture.  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  No sign of neoplasm.  X-rays left hip: He has bilateral moderate hip DJD with no sign of stress fracture or neoplasm.    Assessment & Plan: 1.  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  Cannot rule out upper lumbar nerve impingement. -He has responded well to prednisone in the past so we will treat with a short course. -If symptoms persist, could try a diagnostic left hip injection or possibly consider imaging with MRI scan.     Procedures: No procedures performed  No notes on file     PMFS History: Patient Active Problem List   Diagnosis Date Noted  . Malignant neoplasm of prostate ([place]) 10/13/2014   Past Medical History:  Diagnosis Date  . Diabetes mellitus ([place])   . Erectile dysfunction   . Hypercholesteremia   . Hypertension   . Male stress incontinence   . Nonalcoholic fatty liver disease   . Osteopenia   . Prostate cancer ([place])   . Sleep apnea   . Vitamin D deficiency     Family History  Problem Relation Age of Onset  . Heart attack Father 84  . Diabetes Mellitus I  Mother   . Hypertension Mother   . Stroke Mother     Past Surgical History:  Procedure Laterality Date  . cholecytectomy    . FOOT SURGERY    . History of colonoscopy    . hx of Prostatect Retropubic Radical w/ Nerve Sparing Laparoscopic    . Laparoscopy Repair of Initial Inguinal Hernia    . LIVER BIOPSY     Social History   Occupational History  . Occupation: Special educational needs teacher  Tobacco Use  . Smoking status: Former Smoker    Packs/day: 1.00    Years: 20.00    Pack years: 20.00    Types: Cigarettes  . Smokeless tobacco: Never Used  . Tobacco comment: Quit 20 years ago  Substance and Sexual Activity  . Alcohol use: No    Alcohol/week: 0.0 standard drinks  . Drug use: Not on file  . Sexual activity: Not on file

## 2019-09-04 ENCOUNTER — Other Ambulatory Visit: Payer: Self-pay | Admitting: Family Medicine

## 2019-09-04 ENCOUNTER — Ambulatory Visit
Admission: RE | Admit: 2019-09-04 | Discharge: 2019-09-04 | Disposition: A | Discharge status: Home / Self Care | Payer: PRIVATE HEALTH INSURANCE | Source: Ambulatory Visit | Attending: Family Medicine | Admitting: Family Medicine

## 2019-09-04 DIAGNOSIS — R0609 Other forms of dyspnea: Secondary | ICD-10-CM

## 2019-09-23 ENCOUNTER — Other Ambulatory Visit: Payer: Self-pay | Admitting: Internal Medicine

## 2019-09-23 DIAGNOSIS — N1831 Chronic kidney disease, stage 3a: Secondary | ICD-10-CM

## 2019-10-07 ENCOUNTER — Telehealth: Payer: Self-pay

## 2019-10-07 NOTE — Telephone Encounter (Signed)
NOTES ON FILE FROM  [facility] [phone] SENT REFERRAL TO SCHEDULING

## 2019-11-18 ENCOUNTER — Ambulatory Visit: Payer: PRIVATE HEALTH INSURANCE | Admitting: Cardiology

## 2019-12-03 NOTE — Progress Notes (Signed)
Cardiology Office Note:    Date:  12/04/2019   ID:  [name], DOB 14-Oct-1945, MRN [id number]  PCP:  Bartholome Bill, MD  Cardiologist:  No primary care provider on file.   Referring MD: No ref. provider found   Chief Complaint  Patient presents with   Congestive Heart Failure   Coronary Artery Disease   Hypertension    CKD stage IIIb   Advice Only    History of Present Illness:    [name] is a 74 y.o. male with a hx of dyspnea on exertion.  He is referred by his primary physician Dr. Precious Haws.  The patient has a background history of prediabetes, essential hypertension, fatty liver, prostate cancer, obstructive sleep apnea (not treated), and CKD stage IIIb.  The patient is a very pleasant gentleman who who has multiple cardiovascular risk factors as noted above.  For 1 to 2 years he has been experiencing exertional dyspnea.  If he does any physical activity that requires more than moderate exertion he gets extremely short of breath.  This is also associated with bilateral gluteal discomfort.  If he stops and rests, it improves.  He denies orthopnea and PND.  He has not had significant lower extremity swelling.  I do note on his medication list that he has done some adjustments in therapy even after his physician has made recommendations.  He is on lisinopril HCTZ 20/12.5 mg twice daily however his physician at recently asked him to cut it down to once per day.  Amlodipine 10 mg/day is on his list.  He is not taking it.  Finally, the patient has vague recurring episodes of chest tightness.  These can occur with physical activity but is not predictable.  He also has a discomfort at rest.  Episodes can last up to 10 minutes.  Intensity is very mild.  There is no diaphoresis, radiation, or other typical ischemic complaints.  Past Medical History:  Diagnosis Date   Diabetes mellitus ([place])    Erectile dysfunction    Hypercholesteremia    Hypertension     Male stress incontinence    Nonalcoholic fatty liver disease    Osteopenia    Prostate cancer ([place])    Sleep apnea    Vitamin D deficiency     Past Surgical History:  Procedure Laterality Date   cholecytectomy     FOOT SURGERY     History of colonoscopy     hx of Prostatect Retropubic Radical w/ Nerve Sparing Laparoscopic     Laparoscopy Repair of Initial Inguinal Hernia     LIVER BIOPSY      Current Medications: Current Meds  Medication Sig   amLODipine (NORVASC) 10 MG tablet Take 10 mg by mouth daily.   ascorbic acid (VITAMIN C) 1000 MG tablet    aspirin EC 81 MG tablet Take 81 mg by mouth daily.   calcium-vitamin D (OSCAL WITH D) 500-200 MG-UNIT per tablet Take 1 tablet by mouth. Actually taking 600/400   cholecalciferol (VITAMIN D) 1000 UNITS tablet Take 1,000 Units by mouth daily.   clobetasol cream (TEMOVATE) 5.72 % Apply 1 application topically 2 (two) times daily.   fexofenadine (ALLEGRA) 60 MG tablet Take 180 mg by mouth 2 (two) times daily.   glucosamine-chondroitin 500-400 MG tablet Take 1 tablet by mouth 3 (three) times daily.   lisinopril-hydrochlorothiazide (PRINZIDE,ZESTORETIC) 20-12.5 MG per tablet Take 1 tablet by mouth in the morning and at bedtime.    Multiple Vitamin (MULTIVITAMIN)  tablet Take 1 tablet by mouth daily.   niacin 500 MG tablet Take 500 mg by mouth at bedtime.   Omega-3 Fatty Acids (FISH OIL) 1000 MG CAPS Take 1 capsule by mouth daily.    Potassium Gluconate 2.5 MEQ TABS Take by mouth.   predniSONE (DELTASONE) 10 MG tablet Take as directed for 12 days.  Daily dose 6,6,5,5,4,4,3,3,2,2,1,1.   PRESCRIPTION MEDICATION Inject into the muscle 3 (three) times a week. Trimex as needed. Injectable   propranolol (INNOPRAN XL) 80 MG 24 hr capsule Take by mouth.   PROPRANOLOL HCL ER PO Take 80 mg by mouth once.   Psyllium (METAMUCIL FREE & NATURAL) 43 % POWD    zinc gluconate 50 MG tablet      Allergies:   Other and  Tetracyclines & related   Social History   Socioeconomic History   Marital status: Married    Spouse name: Not on file   Number of children: 3   Years of education: Not on file   Highest education level: Not on file  Occupational History   Occupation: Special educational needs teacher  Tobacco Use   Smoking status: Former Smoker    Packs/day: 1.00    Years: 20.00    Pack years: 20.00    Types: Cigarettes   Smokeless tobacco: Never Used   Tobacco comment: Quit 20 years ago  Substance and Sexual Activity   Alcohol use: No    Alcohol/week: 0.0 standard drinks   Drug use: Not on file   Sexual activity: Not on file  Other Topics Concern   Not on file  Social History Narrative   Not on file   Social Determinants of Health   Financial Resource Strain:    Difficulty of Paying Living Expenses: Not on file  Food Insecurity:    Worried About Charity fundraiser in the Last Year: Not on file   [employer] of Food in the Last Year: Not on file  Transportation Needs:    Lack of Transportation (Medical): Not on file   Lack of Transportation (Non-Medical): Not on file  Physical Activity:    Days of Exercise per Week: Not on file   Minutes of Exercise per Session: Not on file  Stress:    Feeling of Stress : Not on file  Social Connections:    Frequency of Communication with Friends and Family: Not on file   Frequency of Social Gatherings with Friends and Family: Not on file   Attends Religious Services: Not on file   Active Member of Clubs or Organizations: Not on file   Attends Archivist Meetings: Not on file   Marital Status: Not on file     Family History: The patient's family history includes Diabetes Mellitus I in his mother; Heart attack (age of onset: 37) in his father; Hypertension in his mother; Stroke in his mother.  ROS:   Please see the history of present illness.    He has difficulty with his balance.  He has an ear problem that causes balance as  well.  He snores.  He reveals to me that he has been diagnosed with sleep apnea.  There is a family history of diabetes mellitus, end-stage kidney disease, and myocardial infarction.  Father died of an MI at age 46.  All other systems reviewed and are negative.  EKGs/Labs/Other Studies Reviewed:    The following studies were reviewed today: No new or recent cardiac imaging  EKG:  EKG a study performed on  August 18, 2019 demonstrates possible inferior infarct.  Poor R wave progression V1 through V4.  Nonspecific ST abnormality.  Recent Labs: No results found for requested labs within last 8760 hours.  Recent Lipid Panel    Component Value Date/Time   CHOL  08/29/2008 0412    131        ATP III CLASSIFICATION:  <200     mg/dL   Desirable  200-239  mg/dL   Borderline High  >=240    mg/dL   High          TRIG 69 08/29/2008 0412   HDL 29 (L) 08/29/2008 0412   CHOLHDL 4.5 08/29/2008 0412   VLDL 14 08/29/2008 0412   LDLCALC  08/29/2008 0412    88        Total Cholesterol/HDL:CHD Risk Coronary Heart Disease Risk Table                     Men   Women  1/2 Average Risk   3.4   3.3  Average Risk       5.0   4.4  2 X Average Risk   9.6   7.1  3 X Average Risk  23.4   11.0        Use the calculated Patient Ratio above and the CHD Risk Table to determine the patient's CHD Risk.        ATP III CLASSIFICATION (LDL):  <100     mg/dL   Optimal  100-129  mg/dL   Near or Above                    Optimal  130-159  mg/dL   Borderline  160-189  mg/dL   High  >190     mg/dL   Very High    Physical Exam:    VS:  BP 138/70    Pulse 65    Ht $R'5\' 11"'YB$  (1.803 m)    Wt 260 lb 6.4 oz (118.1 kg)    SpO2 97%    BMI 36.32 kg/m     Wt Readings from Last 3 Encounters:  12/04/19 260 lb 6.4 oz (118.1 kg)  08/22/17 248 lb (112.5 kg)  05/02/17 263 lb 12.8 oz (119.7 kg)     GEN: Obese with significant abdominal obesity.. No acute distress HEENT: Normal NECK: Mild JVD when lying at 30  degrees. LYMPHATICS: No lymphadenopathy CARDIAC: No gallop RRR without murmur, or edema. VASCULAR:  Normal Pulses. No bruits. RESPIRATORY:  Clear to auscultation without rales, wheezing or rhonchi  ABDOMEN: Soft, non-tender, non-distended, No pulsatile mass, MUSCULOSKELETAL: No deformity  SKIN: Warm and dry NEUROLOGIC:  Alert and oriented x 3 PSYCHIATRIC:  Normal affect   ASSESSMENT:    1. DOE (dyspnea on exertion)   2. Chest pain of uncertain etiology   3. Primary hypertension   4. Prediabetes   5. Stage 3b chronic kidney disease ([place])   6. Fatty liver   7. Morbid obesity ([place])   8. SOB (shortness of breath)   9. Malignant neoplasm of prostate ([place])   10. Educated about COVID-19 virus infection    PLAN:    In order of problems listed above:  1. Etiology is uncertain.  The patient has multiple risk factors for atherosclerotic disease and diastolic heart failure including.  2D Doppler echocardiogram will be done to assess for evidence of right heart failure given untreated sleep apnea.  Evaluate for left ventricular systolic/diastolic dysfunction.  Because shortness of breath is an issue, a BNP will also be done. 2. Exclude myocardial ischemia.  His story is not that of typical angina.  Coronary calcium score will be done to screen for evidence of atherosclerosis.  If significantly abnormal high risk such as greater than 400, may need functional or anatomic evaluation. 3. Blood pressure control is not optimal.  Target systolic blood pressure especially in light of stage III CKD is systolic pressure less than 130.  May need to have add-on therapy.  He is discontinued amlodipine and increased Zestoretic despite his physician having decreased his dose by 50%.  I did recommend a low-sodium diet/DASH DIET. 4. Last hemoglobin A1c was 5.8.  Decrease carbohydrate intake and physical activity would be important. 5. We discussed in detail the CKD.  Risk factors include glycemic control, and  better blood pressure control.  We will check be met today. 6. This is a independent risk factor for cardiac events.  The diagnosis was made when he was a heavy drinker.  I am not sure that it is been followed up on. 7. He has morbid obesity and needs to lose weight.  Decrease caloric intake and exercise will be helpful. 8. BNP to help differentiate between whether he has chronic lung disease versus heart failure. 9. Not discussed in detail.  Last PSA was 0.01. 10. He has been vaccinated and practicing social distancing.   Medication Adjustments/Labs and Tests Ordered: Current medicines are reviewed at length with the patient today.  Concerns regarding medicines are outlined above.  Orders Placed This Encounter  Procedures   CT CARDIAC SCORING   Pro b natriuretic peptide   Basic metabolic panel   ECHOCARDIOGRAM COMPLETE   No orders of the defined types were placed in this encounter.   Patient Instructions  Medication Instructions:  Your physician recommends that you continue on your current medications as directed. Please refer to the Current Medication list given to you today.  *If you need a refill on your cardiac medications before your next appointment, please call your pharmacy*   Lab Work: BMET and Pro BNP today  If you have labs (blood work) drawn today and your tests are completely normal, you will receive your results only by:  [place] (if you have MyChart) OR  A paper copy in the mail If you have any lab test that is abnormal or we need to change your treatment, we will call you to review the results.   Testing/Procedures: Your physician has requested that you have an echocardiogram. Echocardiography is a painless test that uses sound waves to create images of your heart. It provides your doctor with information about the size and shape of your heart and how well your hearts chambers and valves are working. This procedure takes approximately one hour.  There are no restrictions for this procedure.  Your physician recommends that you have a Calcium Score performed.    Follow-Up: At [facility], you and your health needs are our priority.  As part of our continuing mission to provide you with exceptional heart care, we have created designated Provider Care Teams.  These Care Teams include your primary Cardiologist (physician) and Advanced Practice Providers (APPs -  Physician Assistants and Nurse Practitioners) who all work together to provide you with the care you need, when you need it.  We recommend signing up for the patient portal called "MyChart".  Sign up information is provided on this After Visit Summary.  MyChart is used to  connect with patients for Virtual Visits (Telemedicine).  Patients are able to view lab/test results, encounter notes, upcoming appointments, etc.  Non-urgent messages can be sent to your provider as well.   To learn more about what you can do with MyChart, go to [email or web address].    Your next appointment:   3 month(s)  The format for your next appointment:   In Person  Provider:   You may see Dr. Daneen Schick or one of the following Advanced Practice Providers on your designated Care Team:    Truitt Merle, NP  Cecilie Kicks, NP  Kathyrn Drown, NP    Other Instructions  PLEASE RESUME USING YOUR CPAP     Signed, Sinclair Grooms, MD  12/04/2019 12:01 PM    [place]

## 2019-12-04 ENCOUNTER — Ambulatory Visit (INDEPENDENT_AMBULATORY_CARE_PROVIDER_SITE_OTHER): Payer: Medicare Other | Admitting: Interventional Cardiology

## 2019-12-04 ENCOUNTER — Encounter: Payer: Self-pay | Admitting: Interventional Cardiology

## 2019-12-04 ENCOUNTER — Other Ambulatory Visit: Payer: Self-pay

## 2019-12-04 VITALS — BP 138/70 | HR 65 | Ht 71.0 in | Wt 260.4 lb

## 2019-12-04 DIAGNOSIS — R0609 Other forms of dyspnea: Secondary | ICD-10-CM

## 2019-12-04 DIAGNOSIS — R079 Chest pain, unspecified: Secondary | ICD-10-CM | POA: Diagnosis not present

## 2019-12-04 DIAGNOSIS — K760 Fatty (change of) liver, not elsewhere classified: Secondary | ICD-10-CM

## 2019-12-04 DIAGNOSIS — Z7189 Other specified counseling: Secondary | ICD-10-CM

## 2019-12-04 DIAGNOSIS — R0602 Shortness of breath: Secondary | ICD-10-CM

## 2019-12-04 DIAGNOSIS — R0600 Dyspnea, unspecified: Secondary | ICD-10-CM | POA: Diagnosis not present

## 2019-12-04 DIAGNOSIS — I10 Essential (primary) hypertension: Secondary | ICD-10-CM

## 2019-12-04 DIAGNOSIS — C61 Malignant neoplasm of prostate: Secondary | ICD-10-CM

## 2019-12-04 DIAGNOSIS — N1832 Chronic kidney disease, stage 3b: Secondary | ICD-10-CM

## 2019-12-04 DIAGNOSIS — R7303 Prediabetes: Secondary | ICD-10-CM | POA: Diagnosis not present

## 2019-12-04 NOTE — Patient Instructions (Signed)
Medication Instructions:  Your physician recommends that you continue on your current medications as directed. Please refer to the Current Medication list given to you today.  *If you need a refill on your cardiac medications before your next appointment, please call your pharmacy*   Lab Work: BMET and Pro BNP today  If you have labs (blood work) drawn today and your tests are completely normal, you will receive your results only by: [place] MyChart Message (if you have MyChart) OR . A paper copy in the mail If you have any lab test that is abnormal or we need to change your treatment, we will call you to review the results.   Testing/Procedures: Your physician has requested that you have an echocardiogram. Echocardiography is a painless test that uses sound waves to create images of your heart. It provides your doctor with information about the size and shape of your heart and how well your heart's chambers and valves are working. This procedure takes approximately one hour. There are no restrictions for this procedure.  Your physician recommends that you have a Calcium Score performed.    Follow-Up: At [facility], you and your health needs are our priority.  As part of our continuing mission to provide you with exceptional heart care, we have created designated Provider Care Teams.  These Care Teams include your primary Cardiologist (physician) and Advanced Practice Providers (APPs -  Physician Assistants and Nurse Practitioners) who all work together to provide you with the care you need, when you need it.  We recommend signing up for the patient portal called "MyChart".  Sign up information is provided on this After Visit Summary.  MyChart is used to connect with patients for Virtual Visits (Telemedicine).  Patients are able to view lab/test results, encounter notes, upcoming appointments, etc.  Non-urgent messages can be sent to your provider as well.   To learn more about what you can do  with MyChart, go to [email or web address].    Your next appointment:   3 month(s)  The format for your next appointment:   In Person  Provider:   You may see Dr. Daneen Schick or one of the following Advanced Practice Providers on your designated Care Team:    Truitt Merle, NP  Cecilie Kicks, NP  Kathyrn Drown, NP    Other Instructions  PLEASE RESUME USING YOUR CPAP

## 2019-12-05 LAB — BASIC METABOLIC PANEL
BUN/Creatinine Ratio: 10 (ref 10–24)
BUN: 19 mg/dL (ref 8–27)
CO2: 23 mmol/L (ref 20–29)
Calcium: 10 mg/dL (ref 8.6–10.2)
Chloride: 106 mmol/L (ref 96–106)
Creatinine, Ser: 1.98 mg/dL — ABNORMAL HIGH (ref 0.76–1.27)
GFR calc Af Amer: 38 mL/min/{1.73_m2} — ABNORMAL LOW (ref >59)
GFR calc non Af Amer: 33 mL/min/{1.73_m2} — ABNORMAL LOW (ref >59)
Glucose: 101 mg/dL — ABNORMAL HIGH (ref 65–99)
Potassium: 4.2 mmol/L (ref 3.5–5.2)
Sodium: 144 mmol/L (ref 134–144)

## 2019-12-05 LAB — PRO B NATRIURETIC PEPTIDE: NT-Pro BNP: 44 pg/mL (ref 0–376)

## 2019-12-28 ENCOUNTER — Ambulatory Visit
Admission: RE | Admit: 2019-12-28 | Discharge: 2019-12-28 | Disposition: A | Discharge status: Home / Self Care | Payer: Self-pay | Source: Ambulatory Visit | Attending: Interventional Cardiology | Admitting: Interventional Cardiology

## 2019-12-28 ENCOUNTER — Other Ambulatory Visit: Payer: Self-pay

## 2019-12-28 ENCOUNTER — Other Ambulatory Visit (HOSPITAL_COMMUNITY): Payer: PRIVATE HEALTH INSURANCE

## 2019-12-28 DIAGNOSIS — I10 Essential (primary) hypertension: Secondary | ICD-10-CM

## 2019-12-28 DIAGNOSIS — R0600 Dyspnea, unspecified: Secondary | ICD-10-CM

## 2019-12-28 DIAGNOSIS — R7303 Prediabetes: Secondary | ICD-10-CM

## 2019-12-28 DIAGNOSIS — R079 Chest pain, unspecified: Secondary | ICD-10-CM

## 2019-12-28 DIAGNOSIS — R0609 Other forms of dyspnea: Secondary | ICD-10-CM

## 2020-01-06 ENCOUNTER — Ambulatory Visit
Admission: RE | Admit: 2020-01-06 | Discharge: 2020-01-06 | Disposition: A | Discharge status: Home / Self Care | Payer: Medicare Other | Source: Ambulatory Visit | Attending: Internal Medicine | Admitting: Internal Medicine

## 2020-01-06 DIAGNOSIS — N1831 Chronic kidney disease, stage 3a: Secondary | ICD-10-CM

## 2020-01-19 ENCOUNTER — Ambulatory Visit (HOSPITAL_COMMUNITY): Payer: Medicare Other | Attending: Internal Medicine

## 2020-01-19 ENCOUNTER — Other Ambulatory Visit: Payer: Self-pay

## 2020-01-19 ENCOUNTER — Other Ambulatory Visit (HOSPITAL_COMMUNITY): Payer: Medicare Other

## 2020-01-19 DIAGNOSIS — R0600 Dyspnea, unspecified: Secondary | ICD-10-CM | POA: Diagnosis not present

## 2020-01-19 DIAGNOSIS — R0609 Other forms of dyspnea: Secondary | ICD-10-CM

## 2020-01-19 LAB — ECHOCARDIOGRAM COMPLETE
Area-P 1/2: 2.8 cm2
S' Lateral: 3.1 cm

## 2020-01-20 ENCOUNTER — Telehealth: Payer: Self-pay | Admitting: *Deleted

## 2020-01-20 ENCOUNTER — Other Ambulatory Visit: Payer: Self-pay | Admitting: *Deleted

## 2020-01-20 NOTE — Telephone Encounter (Signed)
-----   Message from Belva Crome, MD sent at 01/20/2020  3:00 PM EST ----- Let the patient know the coronary calcium score does not reveal any chronic plaque buildup/calcium.  The echocardiogram demonstrates a thicker than normal left ventricle with stiff filling parameters which could contribute to shortness of breath.  The blood work to be did to see if shortness of breath is from cardiac cause was normal.  The only abnormality currently present is on the echocardiogram with hypertrophy and diastolic dysfunction.  This requires tight control of blood pressure to less than 130/80 mmHg.  Is he still having chest pain?  Is he having shortness of breath? A copy will be sent to Bartholome Bill, MD

## 2020-01-20 NOTE — Telephone Encounter (Signed)
Spoke with pt and reviewed results.  Pt states he is still having the same SOB as when he seen Dr. Tamala Julian.  Denies CP.  Updated medication list as pt mentions that he is no longer on Amlodipine.  Doesn't check BP often but states he has checked it once since seeing Dr. Tamala Julian and it was 136/70's.  Advised I will send this information to Dr. Tamala Julian and will call back if any further recommendation, otherwise, keep January appt.

## 2020-01-25 ENCOUNTER — Encounter: Payer: Self-pay | Admitting: Interventional Cardiology

## 2020-01-25 DIAGNOSIS — H832X3 Labyrinthine dysfunction, bilateral: Secondary | ICD-10-CM | POA: Insufficient documentation

## 2020-03-14 ENCOUNTER — Other Ambulatory Visit: Payer: Self-pay

## 2020-03-14 NOTE — Telephone Encounter (Signed)
ERROR

## 2020-03-21 NOTE — Progress Notes (Unsigned)
Cardiology Office Note:    Date:  03/23/2020   ID:  [name], DOB 10-28-1945, MRN [id number]  PCP:  Bartholome Bill, MD  Cardiologist:  Sinclair Grooms, MD   Referring MD: Bartholome Bill, MD   Chief Complaint  Patient presents with  . Shortness of Breath  . Follow-up    Diminished pulse oximetry left foot    History of Present Illness:    [name] is a 75 y.o. male with a hx of initially seen with dyspnea on exertion.  He denies chest pain.  Recent coronary calcium score was unremarkable making significant CAD less probable.  Not having symptoms of angina.  He has obstructive sleep apnea and does not wear CPAP.  Problem list evaluated on last visit: 1. DOE (dyspnea on exertion)   2. Chest pain of uncertain etiology   3. Primary hypertension   4. Prediabetes   5. Stage 3b chronic kidney disease ([place])   6. Fatty liver   7. Morbid obesity ([place])   8. SOB (shortness of breath)   9. Malignant neoplasm of prostate (HCC)    Dyspnea is unchanged. He was noted by home health nurse to have some issues with decreased blood flow in the left leg.  Last saw him in October he was having some dyspnea.  It is unchanged.  An echocardiogram was performed.  It demonstrated left ventricular hypertrophy with EF 65%.  Coronary calcium score was 0, implying an absence of significant obstruction.  Past Medical History:  Diagnosis Date  . Diabetes mellitus ([place])   . Erectile dysfunction   . Hypercholesteremia   . Hypertension   . Male stress incontinence   . Nonalcoholic fatty liver disease   . Osteopenia   . Prostate cancer ([place])   . Sleep apnea   . Vitamin D deficiency     Past Surgical History:  Procedure Laterality Date  . cholecytectomy    . FOOT SURGERY    . History of colonoscopy    . hx of Prostatect Retropubic Radical w/ Nerve Sparing Laparoscopic    . Laparoscopy Repair of Initial Inguinal Hernia    . LIVER BIOPSY      Current  Medications: Current Meds  Medication Sig  . ascorbic acid (VITAMIN C) 1000 MG tablet   . aspirin EC 81 MG tablet Take 81 mg by mouth daily.  . calcium-vitamin D (OSCAL WITH D) 500-200 MG-UNIT per tablet Take 1 tablet by mouth. Actually taking 600/400  . cholecalciferol (VITAMIN D) 1000 UNITS tablet Take 1,000 Units by mouth daily.  . clobetasol cream (TEMOVATE) 9.56 % Apply 1 application topically 2 (two) times daily.  . fexofenadine (ALLEGRA) 60 MG tablet Take 180 mg by mouth daily.  Marland Kitchen glucosamine-chondroitin 500-400 MG tablet Take 1 tablet by mouth 3 (three) times daily.  Marland Kitchen lisinopril-hydrochlorothiazide (PRINZIDE,ZESTORETIC) 20-12.5 MG per tablet Take 1 tablet by mouth in the morning and at bedtime.   . Multiple Vitamin (MULTIVITAMIN) tablet Take 1 tablet by mouth daily.  . niacin 500 MG tablet Take 500 mg by mouth at bedtime.  . Omega-3 Fatty Acids (FISH OIL) 1000 MG CAPS Take 1 capsule by mouth daily.   . Potassium Gluconate 2.5 MEQ TABS Take by mouth.  Marland Kitchen PRESCRIPTION MEDICATION Inject into the muscle 3 (three) times a week. Trimex as needed. Injectable  . propranolol (INNOPRAN XL) 80 MG 24 hr capsule Take by mouth.  Marland Kitchen PROPRANOLOL HCL ER PO Take 80 mg by mouth once.  Marland Kitchen  Psyllium (METAMUCIL FREE & NATURAL) 43 % POWD   . zinc gluconate 50 MG tablet      Allergies:   Other and Tetracyclines & related   Social History   Socioeconomic History  . Marital status: Married    Spouse name: Not on file  . Number of children: 3  . Years of education: Not on file  . Highest education level: Not on file  Occupational History  . Occupation: Special educational needs teacher  Tobacco Use  . Smoking status: Former Smoker    Packs/day: 1.00    Years: 20.00    Pack years: 20.00    Types: Cigarettes  . Smokeless tobacco: Never Used  . Tobacco comment: Quit 20 years ago  Substance and Sexual Activity  . Alcohol use: No    Alcohol/week: 0.0 standard drinks  . Drug use: Not on file  . Sexual activity: Not on  file  Other Topics Concern  . Not on file  Social History Narrative  . Not on file   Social Determinants of Health   Financial Resource Strain: Not on file  Food Insecurity: Not on file  Transportation Needs: Not on file  Physical Activity: Not on file  Stress: Not on file  Social Connections: Not on file     Family History: The patient's family history includes Diabetes Mellitus I in his mother; Heart attack (age of onset: 71) in his father; Hypertension in his mother; Stroke in his mother.  ROS:   Please see the history of present illness.    Concerned about his overall health and whether issues may be developing.  All other systems reviewed and are negative.  EKGs/Labs/Other Studies Reviewed:    The following studies were reviewed today:  Coronary calcium score 12/29/2019: IMPRESSION: Coronary calcium score of 0. This was 0th percentile for age and sex matched control.  2D Doppler echocardiogram 2021: IMPRESSIONS  1. Left ventricular ejection fraction, by estimation, is 60 to 65%. The  left ventricle has normal function. The left ventricle has no regional  wall motion abnormalities. There is mild left ventricular hypertrophy.  Left ventricular diastolic parameters  are consistent with Grade I diastolic dysfunction (impaired relaxation).  The average left ventricular global longitudinal strain is -20.8 %. The  global longitudinal strain is normal.  2. Right ventricular systolic function is normal. The right ventricular  size is normal. Tricuspid regurgitation signal is inadequate for assessing  PA pressure.  3. The mitral valve is normal in structure. No evidence of mitral valve  regurgitation. No evidence of mitral stenosis.  4. The aortic valve is tricuspid. Aortic valve regurgitation is not  visualized. No aortic stenosis is present.  5. The inferior vena cava is normal in size with greater than 50%  respiratory variability, suggesting right atrial pressure  of 3 mmHg.   EKG:  EKG not repeated  Recent Labs: 12/04/2019: BUN 19; Creatinine, Ser 1.98; NT-Pro BNP 44; Potassium 4.2; Sodium 144  Recent Lipid Panel    Component Value Date/Time   CHOL  08/29/2008 0412    131        ATP III CLASSIFICATION:  <200     mg/dL   Desirable  200-239  mg/dL   Borderline High  >=240    mg/dL   High          TRIG 69 08/29/2008 0412   HDL 29 (L) 08/29/2008 0412   CHOLHDL 4.5 08/29/2008 0412   VLDL 14 08/29/2008 0412   LDLCALC  08/29/2008 0932  88        Total Cholesterol/HDL:CHD Risk Coronary Heart Disease Risk Table                     Men   Women  1/2 Average Risk   3.4   3.3  Average Risk       5.0   4.4  2 X Average Risk   9.6   7.1  3 X Average Risk  23.4   11.0        Use the calculated Patient Ratio above and the CHD Risk Table to determine the patient's CHD Risk.        ATP III CLASSIFICATION (LDL):  <100     mg/dL   Optimal  100-129  mg/dL   Near or Above                    Optimal  130-159  mg/dL   Borderline  160-189  mg/dL   High  >190     mg/dL   Very High    Physical Exam:    VS:  BP 130/72 (BP Location: Left Arm, Patient Position: Sitting, Cuff Size: Normal)   Pulse 65   Ht 5\' 11"  (1.803 m)   Wt 257 lb (116.6 kg)   SpO2 99%   BMI 35.84 kg/m     Wt Readings from Last 3 Encounters:  03/23/20 257 lb (116.6 kg)  12/04/19 260 lb 6.4 oz (118.1 kg)  08/22/17 248 lb (112.5 kg)     GEN: Obese. No acute distress HEENT: Normal NECK: No JVD. LYMPHATICS: No lymphadenopathy CARDIAC: 2/6 systolic murmur. RRR S4 gallop, but no edema. VASCULAR: Absent left posterior tibial pulse. RESPIRATORY:  Clear to auscultation without rales, wheezing or rhonchi  ABDOMEN: Soft, non-tender, non-distended, No pulsatile mass, MUSCULOSKELETAL: No deformity  SKIN: Warm and dry NEUROLOGIC:  Alert and oriented x 3 PSYCHIATRIC:  Normal affect   ASSESSMENT:    1. DOE (dyspnea on exertion)   2. Chest pain of uncertain etiology   3. PAD  (peripheral artery disease) (HCC)   4. Stage 3b chronic kidney disease ([place])   5. Primary hypertension   6. Prediabetes   7. Educated about COVID-19 virus infection   8. Decreased pedal pulses    PLAN:    In order of problems listed above:  1. Change in likely related to physical deconditioning, obesity, and diastolic dysfunction. 2. No chest discomfort reported. 3. Bilateral lower extremity arterial Doppler study. 4. Stage IV CKD. 5. Blood pressure target 130/80 is being achieved. 6. Prediabetes with goal keeping A1c less than 7. 7. Vaccinated and practicing medication. 8. Bilateral arterial Doppler study.    Medication Adjustments/Labs and Tests Ordered: Current medicines are reviewed at length with the patient today.  Concerns regarding medicines are outlined above.  Orders Placed This Encounter  Procedures  . VAS [place] LOWER EXTREMITY ARTERIAL DUPLEX   No orders of the defined types were placed in this encounter.   Patient Instructions  Medication Instructions:  Your physician recommends that you continue on your current medications as directed. Please refer to the Current Medication list given to you today.  *If you need a refill on your cardiac medications before your next appointment, please call your pharmacy*   Lab Work: None If you have labs (blood work) drawn today and your tests are completely normal, you will receive your results only by: Marland Kitchen MyChart Message (if you have MyChart) OR . A paper  copy in the mail If you have any lab test that is abnormal or we need to change your treatment, we will call you to review the results.   Testing/Procedures: Your physician has requested that you have a lower or upper extremity arterial duplex. This test is an ultrasound of the arteries in the legs or arms. It looks at arterial blood flow in the legs and arms. Allow one hour for Lower and Upper Arterial scans. There are no restrictions or special  instructions   Follow-Up: At [facility], you and your health needs are our priority.  As part of our continuing mission to provide you with exceptional heart care, we have created designated Provider Care Teams.  These Care Teams include your primary Cardiologist (physician) and Advanced Practice Providers (APPs -  Physician Assistants and Nurse Practitioners) who all work together to provide you with the care you need, when you need it.  We recommend signing up for the patient portal called "MyChart".  Sign up information is provided on this After Visit Summary.  MyChart is used to connect with patients for Virtual Visits (Telemedicine).  Patients are able to view lab/test results, encounter notes, upcoming appointments, etc.  Non-urgent messages can be sent to your provider as well.   To learn more about what you can do with MyChart, go to [email or web address].    Your next appointment:   4 month(s)  The format for your next appointment:   In Person  Provider:   You may see Sinclair Grooms, MD or one of the following Advanced Practice Providers on your designated Care Team:    Kathyrn Drown, NP    Other Instructions      Signed, Sinclair Grooms, MD  03/23/2020 11:28 AM    [place]

## 2020-03-23 ENCOUNTER — Other Ambulatory Visit: Payer: Self-pay

## 2020-03-23 ENCOUNTER — Ambulatory Visit: Payer: Medicare HMO | Admitting: Interventional Cardiology

## 2020-03-23 ENCOUNTER — Encounter: Payer: Self-pay | Admitting: Interventional Cardiology

## 2020-03-23 VITALS — BP 130/72 | HR 65 | Ht 71.0 in | Wt 257.0 lb

## 2020-03-23 DIAGNOSIS — R079 Chest pain, unspecified: Secondary | ICD-10-CM

## 2020-03-23 DIAGNOSIS — N1832 Chronic kidney disease, stage 3b: Secondary | ICD-10-CM

## 2020-03-23 DIAGNOSIS — R0600 Dyspnea, unspecified: Secondary | ICD-10-CM

## 2020-03-23 DIAGNOSIS — R0609 Other forms of dyspnea: Secondary | ICD-10-CM

## 2020-03-23 DIAGNOSIS — Z7189 Other specified counseling: Secondary | ICD-10-CM

## 2020-03-23 DIAGNOSIS — R7303 Prediabetes: Secondary | ICD-10-CM

## 2020-03-23 DIAGNOSIS — I10 Essential (primary) hypertension: Secondary | ICD-10-CM

## 2020-03-23 DIAGNOSIS — R0989 Other specified symptoms and signs involving the circulatory and respiratory systems: Secondary | ICD-10-CM

## 2020-03-23 DIAGNOSIS — I739 Peripheral vascular disease, unspecified: Secondary | ICD-10-CM | POA: Diagnosis not present

## 2020-03-23 NOTE — Patient Instructions (Signed)
Medication Instructions:  Your physician recommends that you continue on your current medications as directed. Please refer to the Current Medication list given to you today.  *If you need a refill on your cardiac medications before your next appointment, please call your pharmacy*   Lab Work: None If you have labs (blood work) drawn today and your tests are completely normal, you will receive your results only by: Marland Kitchen MyChart Message (if you have MyChart) OR . A paper copy in the mail If you have any lab test that is abnormal or we need to change your treatment, we will call you to review the results.   Testing/Procedures: Your physician has requested that you have a lower or upper extremity arterial duplex. This test is an ultrasound of the arteries in the legs or arms. It looks at arterial blood flow in the legs and arms. Allow one hour for Lower and Upper Arterial scans. There are no restrictions or special instructions   Follow-Up: At [facility], you and your health needs are our priority.  As part of our continuing mission to provide you with exceptional heart care, we have created designated Provider Care Teams.  These Care Teams include your primary Cardiologist (physician) and Advanced Practice Providers (APPs -  Physician Assistants and Nurse Practitioners) who all work together to provide you with the care you need, when you need it.  We recommend signing up for the patient portal called "MyChart".  Sign up information is provided on this After Visit Summary.  MyChart is used to connect with patients for Virtual Visits (Telemedicine).  Patients are able to view lab/test results, encounter notes, upcoming appointments, etc.  Non-urgent messages can be sent to your provider as well.   To learn more about what you can do with MyChart, go to [email or web address].    Your next appointment:   4 month(s)  The format for your next appointment:   In Person  Provider:   You may  see Sinclair Grooms, MD or one of the following Advanced Practice Providers on your designated Care Team:    Kathyrn Drown, NP    Other Instructions

## 2020-03-31 ENCOUNTER — Other Ambulatory Visit: Payer: Self-pay | Admitting: Interventional Cardiology

## 2020-03-31 DIAGNOSIS — R0989 Other specified symptoms and signs involving the circulatory and respiratory systems: Secondary | ICD-10-CM

## 2020-04-06 ENCOUNTER — Other Ambulatory Visit: Payer: Self-pay

## 2020-04-06 ENCOUNTER — Ambulatory Visit (HOSPITAL_COMMUNITY)
Admission: RE | Admit: 2020-04-06 | Discharge: 2020-04-06 | Disposition: A | Discharge status: Home / Self Care | Payer: Medicare HMO | Source: Ambulatory Visit | Attending: Cardiovascular Disease | Admitting: Cardiovascular Disease

## 2020-04-06 DIAGNOSIS — R0989 Other specified symptoms and signs involving the circulatory and respiratory systems: Secondary | ICD-10-CM

## 2020-04-11 ENCOUNTER — Telehealth: Payer: Self-pay

## 2020-04-11 DIAGNOSIS — I739 Peripheral vascular disease, unspecified: Secondary | ICD-10-CM

## 2020-04-11 NOTE — Telephone Encounter (Signed)
Pt verbalized understanding of his Vascular Ultrasound.... he agrees to a referral to PV. Order placed.

## 2020-04-11 NOTE — Telephone Encounter (Signed)
-----   Message from Belva Crome, MD sent at 04/10/2020  5:36 PM EST ----- Let the patient know there is decreased flow in legs and should see PV specialist, Dr. Gwenlyn Found or Fletcher Anon. A copy will be sent to Bartholome Bill, MD

## 2020-04-26 ENCOUNTER — Ambulatory Visit: Payer: Medicare HMO | Admitting: Cardiovascular Disease

## 2020-04-26 ENCOUNTER — Other Ambulatory Visit: Payer: Self-pay

## 2020-04-26 ENCOUNTER — Encounter: Payer: Self-pay | Admitting: Cardiovascular Disease

## 2020-04-26 VITALS — BP 114/64 | HR 83 | Ht 71.0 in | Wt 255.8 lb

## 2020-04-26 DIAGNOSIS — E785 Hyperlipidemia, unspecified: Secondary | ICD-10-CM | POA: Diagnosis not present

## 2020-04-26 DIAGNOSIS — I10 Essential (primary) hypertension: Secondary | ICD-10-CM

## 2020-04-26 DIAGNOSIS — I739 Peripheral vascular disease, unspecified: Secondary | ICD-10-CM

## 2020-04-26 NOTE — Patient Instructions (Signed)

## 2020-04-26 NOTE — Progress Notes (Signed)
Cardiology Office Note   Date:  04/26/2020   ID:  [name], DOB 1945-10-30, MRN [id number]  PCP:  Bartholome Bill, MD  Cardiologist:  Dr. Tamala Julian  No chief complaint on file.     History of Present Illness: [name] is a 75 y.o. male who was referred by Dr. Tamala Julian for evaluation and management of peripheral arterial disease. He has known history of prediabetes, stage IIIb chronic kidney disease, morbid obesity and previous prostate cancer. He was evaluated by Dr. Tamala Julian for atypical chest pain and exertional dyspnea.  Echocardiogram showed normal LV systolic function.  His calcium score was 0.  The patient was noted to have diminished pedal pulses and thus was referred for Doppler studies which showed an ABI of 1.02 on the right and 0.93 on the left.  Duplex showed occluded anterior tibial artery on the right.  On the left, the anterior tibial artery was also occluded. He reports frequent foot numbness that mostly happens at rest when he sitting but not with exertion.  He denies leg claudication.  No lower extremity ulceration.  He is not a smoker.  Past Medical History:  Diagnosis Date  . Diabetes mellitus ([place])   . Erectile dysfunction   . Hypercholesteremia   . Hypertension   . Male stress incontinence   . Nonalcoholic fatty liver disease   . Osteopenia   . Prostate cancer ([place])   . Sleep apnea   . Vitamin D deficiency     Past Surgical History:  Procedure Laterality Date  . cholecytectomy    . FOOT SURGERY    . History of colonoscopy    . hx of Prostatect Retropubic Radical w/ Nerve Sparing Laparoscopic    . Laparoscopy Repair of Initial Inguinal Hernia    . LIVER BIOPSY       Current Outpatient Medications  Medication Sig Dispense Refill  . ascorbic acid (VITAMIN C) 1000 MG tablet     . aspirin EC 81 MG tablet Take 81 mg by mouth daily.    . calcium-vitamin D (OSCAL WITH D) 500-200 MG-UNIT per tablet Take 1 tablet by mouth. Actually taking  600/400    . cholecalciferol (VITAMIN D) 1000 UNITS tablet Take 1,000 Units by mouth daily.    . clobetasol cream (TEMOVATE) 6.81 % Apply 1 application topically 2 (two) times daily.    Marland Kitchen lisinopril-hydrochlorothiazide (PRINZIDE,ZESTORETIC) 20-12.5 MG per tablet Take 1 tablet by mouth in the morning and at bedtime.     . Multiple Vitamin (MULTIVITAMIN) tablet Take 1 tablet by mouth daily.    . niacin 500 MG tablet Take 500 mg by mouth at bedtime. Patient takes 2 daily    . Omega-3 Fatty Acids (FISH OIL) 1000 MG CAPS Take 1 capsule by mouth daily.     . Potassium Gluconate 2.5 MEQ TABS Take by mouth.    Marland Kitchen PRESCRIPTION MEDICATION Inject into the muscle 3 (three) times a week. Trimex as needed. Injectable    . propranolol (INNOPRAN XL) 80 MG 24 hr capsule Take by mouth.    . Psyllium (METAMUCIL FREE & NATURAL) 43 % POWD     . zinc gluconate 50 MG tablet     . fexofenadine (ALLEGRA) 60 MG tablet Take 180 mg by mouth daily.     No current facility-administered medications for this visit.    Allergies:   Other and Tetracyclines & related    Social History:  The patient  reports that he has quit  smoking. His smoking use included cigarettes. He has a 20.00 pack-year smoking history. He has never used smokeless tobacco. He reports that he does not drink alcohol.   Family History:  The patient's family history includes Diabetes Mellitus I in his mother; Heart attack (age of onset: 46) in his father; Hypertension in his mother; Stroke in his mother.    ROS:  Please see the history of present illness.   Otherwise, review of systems are positive for none.   All other systems are reviewed and negative.    PHYSICAL EXAM: VS:  BP 114/64   Pulse 83   Ht 5\' 11"  (1.803 m)   Wt 255 lb 12.8 oz (116 kg)   SpO2 93%   BMI 35.68 kg/m  , BMI Body mass index is 35.68 kg/m. GEN: Well nourished, well developed, in no acute distress  HEENT: normal  Neck: no JVD, carotid bruits, or masses Cardiac: RRR; no  murmurs, rubs, or gallops,no edema  Respiratory:  clear to auscultation bilaterally, normal work of breathing GI: soft, nontender, nondistended, + BS MS: no deformity or atrophy  Skin: warm and dry, no rash Neuro:  Strength and sensation are intact Psych: euthymic mood, full affect Vascular: Femoral pulse normal bilaterally.  Posterior tibial is +2 on the right and +1 on the left.  Dorsalis pedis is not palpable  EKG:  EKG is not ordered today.    Recent Labs: 12/04/2019: BUN 19; Creatinine, Ser 1.98; NT-Pro BNP 44; Potassium 4.2; Sodium 144    Lipid Panel    Component Value Date/Time   CHOL  08/29/2008 0412    131        ATP III CLASSIFICATION:  <200     mg/dL   Desirable  200-239  mg/dL   Borderline High  >=240    mg/dL   High          TRIG 69 08/29/2008 0412   HDL 29 (L) 08/29/2008 0412   CHOLHDL 4.5 08/29/2008 0412   VLDL 14 08/29/2008 0412   LDLCALC  08/29/2008 0412    88        Total Cholesterol/HDL:CHD Risk Coronary Heart Disease Risk Table                     Men   Women  1/2 Average Risk   3.4   3.3  Average Risk       5.0   4.4  2 X Average Risk   9.6   7.1  3 X Average Risk  23.4   11.0        Use the calculated Patient Ratio above and the CHD Risk Table to determine the patient's CHD Risk.        ATP III CLASSIFICATION (LDL):  <100     mg/dL   Optimal  100-129  mg/dL   Near or Above                    Optimal  130-159  mg/dL   Borderline  160-189  mg/dL   High  >190     mg/dL   Very High      Wt Readings from Last 3 Encounters:  04/26/20 255 lb 12.8 oz (116 kg)  03/23/20 257 lb (116.6 kg)  12/04/19 260 lb 6.4 oz (118.1 kg)        No flowsheet data found.    ASSESSMENT AND PLAN:  1.  Peripheral arterial disease: The patient does have  evidence of peripheral arterial disease with mildly reduced ABI on the left side and occluded anterior tibial artery bilaterally.  However, currently he has no symptoms related to this.  No claudication or  ulceration.  The numbness in the left foot does not seem to be exertional and likely reflects neuropathy more than anything else. Given that he is asymptomatic, I recommend treatment of risk factors.  Continue aspirin daily.  2.  Hyperlipidemia: Given prediabetes status and presence of peripheral arterial disease, consider treatment with a statin.  However, he reports that he had abnormal liver enzymes with statins and thus he is currently on niacin and fish oil.  3.  Essential hypertension: Blood pressure is well controlled on current medications.    Disposition:   FU with me as needed if he develops symptoms related to peripheral arterial disease.  Signed,  Kathlyn [place], MD  04/26/2020 9:38 AM    [facility]

## 2020-06-17 DIAGNOSIS — K5900 Constipation, unspecified: Secondary | ICD-10-CM | POA: Insufficient documentation

## 2020-06-17 DIAGNOSIS — I129 Hypertensive chronic kidney disease with stage 1 through stage 4 chronic kidney disease, or unspecified chronic kidney disease: Secondary | ICD-10-CM | POA: Insufficient documentation

## 2020-06-17 DIAGNOSIS — K648 Other hemorrhoids: Secondary | ICD-10-CM | POA: Insufficient documentation

## 2020-07-20 NOTE — Progress Notes (Signed)
Cardiology Office Note:    Date:  07/21/2020   ID:  [name], DOB 07-15-1945, MRN [id number]  PCP:  Bartholome Bill, MD  Cardiologist:  Sinclair Grooms, MD   Referring MD: Bartholome Bill, MD   Chief Complaint  Patient presents with  . Coronary Artery Disease  . Follow-up    PAD CAD Primary hypertension Hyperlipidemia CKD    History of Present Illness:    [name] is a 75 y.o. male with a hx of dyspnea on exertion, chest pain, PAD (occluded anterior tibial bilaterally), stage III CKD, primary hypertension, prediabetes, and morbid obesity.   He feels okay.  He is here today for follow-up.  He has statin intolerance.  He has multiple risk factors and atherosclerosis.  We had a long discussion concerning primary and secondary prevention.  Past Medical History:  Diagnosis Date  . Diabetes mellitus ([place])   . Erectile dysfunction   . Hypercholesteremia   . Hypertension   . Male stress incontinence   . Nonalcoholic fatty liver disease   . Osteopenia   . Prostate cancer ([place])   . Sleep apnea   . Vitamin D deficiency     Past Surgical History:  Procedure Laterality Date  . cholecytectomy    . FOOT SURGERY    . History of colonoscopy    . hx of Prostatect Retropubic Radical w/ Nerve Sparing Laparoscopic    . Laparoscopy Repair of Initial Inguinal Hernia    . LIVER BIOPSY      Current Medications: Current Meds  Medication Sig  . ascorbic acid (VITAMIN C) 1000 MG tablet   . aspirin EC 81 MG tablet Take 81 mg by mouth daily.  . calcium-vitamin D (OSCAL WITH D) 500-200 MG-UNIT per tablet Take 1 tablet by mouth. Actually taking 600/400  . cholecalciferol (VITAMIN D) 1000 UNITS tablet Take 1,000 Units by mouth daily.  . clobetasol cream (TEMOVATE) 6.57 % Apply 1 application topically as needed. Per pt he reports uses as needed only  . fexofenadine (ALLEGRA) 60 MG tablet Take 180 mg by mouth daily.  Marland Kitchen lisinopril-hydrochlorothiazide  (PRINZIDE,ZESTORETIC) 20-12.5 MG per tablet Take 1 tablet by mouth in the morning and at bedtime.   . Multiple Vitamin (MULTIVITAMIN) tablet Take 1 tablet by mouth 2 (two) times a week. Pt states takes multivitamin 2-3 times weekly  . niacin 500 MG tablet Take 500 mg by mouth at bedtime. Patient takes 2 daily  . Omega-3 Fatty Acids (FISH OIL) 1000 MG CAPS Take 1 capsule by mouth daily.   . Potassium Gluconate 2.5 MEQ TABS Take by mouth.  Marland Kitchen PRESCRIPTION MEDICATION Inject into the muscle 3 (three) times a week. Trimex as needed. Injectable  . propranolol (INNOPRAN XL) 80 MG 24 hr capsule Take by mouth.  . Psyllium (METAMUCIL FREE & NATURAL) 43 % POWD   . zinc gluconate 50 MG tablet      Allergies:   Other and Tetracyclines & related   Social History   Socioeconomic History  . Marital status: Married    Spouse name: Not on file  . Number of children: 3  . Years of education: Not on file  . Highest education level: Not on file  Occupational History  . Occupation: Special educational needs teacher  Tobacco Use  . Smoking status: Former Smoker    Packs/day: 1.00    Years: 20.00    Pack years: 20.00    Types: Cigarettes  . Smokeless tobacco: Never Used  .  Tobacco comment: Quit 20 years ago  Substance and Sexual Activity  . Alcohol use: No    Alcohol/week: 0.0 standard drinks  . Drug use: Not on file  . Sexual activity: Not on file  Other Topics Concern  . Not on file  Social History Narrative  . Not on file   Social Determinants of Health   Financial Resource Strain: Not on file  Food Insecurity: Not on file  Transportation Needs: Not on file  Physical Activity: Not on file  Stress: Not on file  Social Connections: Not on file     Family History: The patient's family history includes Diabetes Mellitus I in his mother; Heart attack (age of onset: 72) in his father; Hypertension in his mother; Stroke in his mother.  ROS:   Please see the history of present illness.    Mild claudication all  other systems reviewed and are negative.  EKGs/Labs/Other Studies Reviewed:    The following studies were reviewed today: Laboratory data from physicians at [facility]: October 2021:  LDL 133, total cholesterol 181, 11/2019  Creatinine 1.77 January 2022  Hemoglobin A1c 08/2019 was 5.8  2D Doppler echocardiogram 2021: IMPRESSIONS    1. Left ventricular ejection fraction, by estimation, is 60 to 65%. The  left ventricle has normal function. The left ventricle has no regional  wall motion abnormalities. There is mild left ventricular hypertrophy.  Left ventricular diastolic parameters  are consistent with Grade I diastolic dysfunction (impaired relaxation).  The average left ventricular global longitudinal strain is -20.8 %. The  global longitudinal strain is normal.  2. Right ventricular systolic function is normal. The right ventricular  size is normal. Tricuspid regurgitation signal is inadequate for assessing  PA pressure.  3. The mitral valve is normal in structure. No evidence of mitral valve  regurgitation. No evidence of mitral stenosis.  4. The aortic valve is tricuspid. Aortic valve regurgitation is not  visualized. No aortic stenosis is present.  5. The inferior vena cava is normal in size with greater than 50%  respiratory variability, suggesting right atrial pressure of 3 mmHg.    EKG:  EKG sinus bradycardia, 51 bpm, low voltage, inferior Q waves lead III and aVF.  Nonspecific T wave  flattening.  When compared to the tracing from July 2021, no significant changes noted.  Recent Labs: 12/04/2019: BUN 19; Creatinine, Ser 1.98; NT-Pro BNP 44; Potassium 4.2; Sodium 144  Recent Lipid Panel    Component Value Date/Time   CHOL  08/29/2008 0412    131        ATP III CLASSIFICATION:  <200     mg/dL   Desirable  200-239  mg/dL   Borderline High  >=240    mg/dL   High          TRIG 69 08/29/2008 0412   HDL 29 (L) 08/29/2008 0412   CHOLHDL 4.5  08/29/2008 0412   VLDL 14 08/29/2008 0412   LDLCALC  08/29/2008 0412    88        Total Cholesterol/HDL:CHD Risk Coronary Heart Disease Risk Table                     Men   Women  1/2 Average Risk   3.4   3.3  Average Risk       5.0   4.4  2 X Average Risk   9.6   7.1  3 X Average Risk  23.4   11.0  Use the calculated Patient Ratio above and the CHD Risk Table to determine the patient's CHD Risk.        ATP III CLASSIFICATION (LDL):  <100     mg/dL   Optimal  100-129  mg/dL   Near or Above                    Optimal  130-159  mg/dL   Borderline  160-189  mg/dL   High  >190     mg/dL   Very High    Physical Exam:    VS:  BP 100/62   Pulse (!) 51   Ht 5\' 11"  (1.803 m)   Wt 257 lb (116.6 kg)   BMI 35.84 kg/m     Wt Readings from Last 3 Encounters:  07/21/20 257 lb (116.6 kg)  04/26/20 255 lb 12.8 oz (116 kg)  03/23/20 257 lb (116.6 kg)     GEN: Obese. No acute distress HEENT: Normal NECK: No JVD. LYMPHATICS: No lymphadenopathy CARDIAC: No murmur. RRR S4 gallop, or edema. VASCULAR:  Normal Pulses. No bruits. RESPIRATORY:  Clear to auscultation without rales, wheezing or rhonchi  ABDOMEN: Soft, non-tender, non-distended, No pulsatile mass, MUSCULOSKELETAL: No deformity  SKIN: Warm and dry NEUROLOGIC:  Alert and oriented x 3 PSYCHIATRIC:  Normal affect   ASSESSMENT:    1. DOE (dyspnea on exertion)   2. PAD (peripheral artery disease) ([place])   3. Essential hypertension   4. Hyperlipidemia, unspecified hyperlipidemia type   5. Stage 3b chronic kidney disease ([place])   6. Prediabetes   7. Primary hypertension    PLAN:    In order of problems listed above:  1. Resolved 2. Secondary prevention.  Lipid management needs to be aggressive.  80 mmHg 3. Excellent blood pressure control on current therapy 4. Unable to use statins based upon his history.  Will refer to the lipid clinic.  We need target LDL less than 70 and preferably less than 55.  May need  PCSK9. 5. Being followed by [facility] Osborne [facility]. 6. Hemoglobin A1c when last checked was 5.8  Overall education and awareness concerning secondary risk prevention was discussed in detail: LDL less than 70, hemoglobin A1c less than 7, blood pressure target less than 130/80 mmHg, >150 minutes of moderate aerobic activity per week, avoidance of smoking, weight control (via diet and exercise), and continued surveillance/management of/for obstructive sleep apnea.    Medication Adjustments/Labs and Tests Ordered: Current medicines are reviewed at length with the patient today.  Concerns regarding medicines are outlined above.  Orders Placed This Encounter  Procedures  . EKG 12-Lead   No orders of the defined types were placed in this encounter.   There are no Patient Instructions on file for this visit.   Signed, Sinclair Grooms, MD  07/21/2020 10:05 AM    [place]

## 2020-07-21 ENCOUNTER — Other Ambulatory Visit: Payer: Self-pay

## 2020-07-21 ENCOUNTER — Ambulatory Visit: Payer: Medicare HMO | Admitting: Interventional Cardiology

## 2020-07-21 ENCOUNTER — Encounter: Payer: Self-pay | Admitting: Interventional Cardiology

## 2020-07-21 VITALS — BP 100/62 | HR 51 | Ht 71.0 in | Wt 257.0 lb

## 2020-07-21 DIAGNOSIS — I10 Essential (primary) hypertension: Secondary | ICD-10-CM

## 2020-07-21 DIAGNOSIS — E785 Hyperlipidemia, unspecified: Secondary | ICD-10-CM

## 2020-07-21 DIAGNOSIS — N1832 Chronic kidney disease, stage 3b: Secondary | ICD-10-CM

## 2020-07-21 DIAGNOSIS — R7303 Prediabetes: Secondary | ICD-10-CM

## 2020-07-21 DIAGNOSIS — R0600 Dyspnea, unspecified: Secondary | ICD-10-CM

## 2020-07-21 DIAGNOSIS — I739 Peripheral vascular disease, unspecified: Secondary | ICD-10-CM

## 2020-07-21 NOTE — Patient Instructions (Signed)
Medication Instructions:  Your physician recommends that you continue on your current medications as directed. Please refer to the Current Medication list given to you today.  *If you need a refill on your cardiac medications before your next appointment, please call your pharmacy*   Lab Work: None If you have labs (blood work) drawn today and your tests are completely normal, you will receive your results only by: Marland Kitchen MyChart Message (if you have MyChart) OR . A paper copy in the mail If you have any lab test that is abnormal or we need to change your treatment, we will call you to review the results.   Testing/Procedures: None   Follow-Up:  Your physician has referred you to our [facility].   At [facility], you and your health needs are our priority.  As part of our continuing mission to provide you with exceptional heart care, we have created designated Provider Care Teams.  These Care Teams include your primary Cardiologist (physician) and Advanced Practice Providers (APPs -  Physician Assistants and Nurse Practitioners) who all work together to provide you with the care you need, when you need it.  We recommend signing up for the patient portal called "MyChart".  Sign up information is provided on this After Visit Summary.  MyChart is used to connect with patients for Virtual Visits (Telemedicine).  Patients are able to view lab/test results, encounter notes, upcoming appointments, etc.  Non-urgent messages can be sent to your provider as well.   To learn more about what you can do with MyChart, go to [email or web address].    Your next appointment:   6-9 month(s)  The format for your next appointment:   In Person  Provider:   You may see Sinclair Grooms, MD or one of the following Advanced Practice Providers on your designated Care Team:    Kathyrn Drown, NP    Other Instructions

## 2020-08-11 ENCOUNTER — Ambulatory Visit (INDEPENDENT_AMBULATORY_CARE_PROVIDER_SITE_OTHER): Payer: Medicare HMO | Admitting: Pharmacist

## 2020-08-11 ENCOUNTER — Other Ambulatory Visit: Payer: Self-pay

## 2020-08-11 ENCOUNTER — Encounter: Payer: Self-pay | Admitting: Pharmacist

## 2020-08-11 DIAGNOSIS — E782 Mixed hyperlipidemia: Secondary | ICD-10-CM | POA: Diagnosis not present

## 2020-08-11 DIAGNOSIS — R635 Abnormal weight gain: Secondary | ICD-10-CM | POA: Insufficient documentation

## 2020-08-11 DIAGNOSIS — I739 Peripheral vascular disease, unspecified: Secondary | ICD-10-CM | POA: Diagnosis not present

## 2020-08-11 DIAGNOSIS — R1032 Left lower quadrant pain: Secondary | ICD-10-CM | POA: Insufficient documentation

## 2020-08-11 DIAGNOSIS — K5730 Diverticulosis of large intestine without perforation or abscess without bleeding: Secondary | ICD-10-CM | POA: Insufficient documentation

## 2020-08-11 DIAGNOSIS — K760 Fatty (change of) liver, not elsewhere classified: Secondary | ICD-10-CM | POA: Insufficient documentation

## 2020-08-11 DIAGNOSIS — K6289 Other specified diseases of anus and rectum: Secondary | ICD-10-CM | POA: Insufficient documentation

## 2020-08-11 DIAGNOSIS — R143 Flatulence: Secondary | ICD-10-CM | POA: Insufficient documentation

## 2020-08-11 DIAGNOSIS — R194 Change in bowel habit: Secondary | ICD-10-CM | POA: Insufficient documentation

## 2020-08-11 LAB — LIPID PANEL
Chol/HDL Ratio: 6.4 ratio — ABNORMAL HIGH (ref 0.0–5.0)
Cholesterol, Total: 178 mg/dL (ref 100–199)
HDL: 28 mg/dL — ABNORMAL LOW (ref >39)
LDL Chol Calc (NIH): 127 mg/dL — ABNORMAL HIGH (ref 0–99)
Triglycerides: 125 mg/dL (ref 0–149)
VLDL Cholesterol Cal: 23 mg/dL (ref 5–40)

## 2020-08-11 NOTE — Patient Instructions (Addendum)
It was nice meeting you today!  We would like to try to get your LDL (bad cholesterol) less than 55  Since we do not currently know what your lab values are, we can update the test today  Try to watch your diet.  Cut down on fried foods and eat more vegetables, whole grains and lean proteins  Try to increase your physical activity up to 30 minutes at least 5 times a week  I will call you with your lab results  Karren Cobble, PharmD, BCACP, CDCES, [place] [address] [place], [place] [address] Phone: [phone]; Fax: [phone] 08/11/2020 10:28 AM

## 2020-08-11 NOTE — Progress Notes (Signed)
Patient ID: [name]                 DOB: November 10, 1945                    MRN: [id number]     HPI: [name] is a 75 y.o. male patient referred to lipid clinic by Dr Tamala Julian. PMH is significant for CKD, PAD, HTN, and OSA.  Has trialed multiple statins in the past but reports they have been discontinued due to elevated LFTs.  Patient presents today in good spirits.  Has history of elevated lipids, however last lipids.  Last lipid panel 12/18/19.  Is concerned regarding statins since he has a history of elevated liver enzymes. Does not recall being on Zetia.   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  Reports is not very physcially active.  Eats a lot of fried foods such as fried chicken, fried fish and fried pork chops.  Does not cook at home but his wife does occasionally.  Drinks mostly water.  Has a strong family history of CAD on his father's side.  Father died of MI at 8.  Mother had DM but lived to 63.    Current Medications: niacin 500, fish oil Intolerances: statins? Risk Factors: PAD, DM, HTN LDL goal: <55  Family History: Father: Mi at 90, Mother: DM  Labs:TC 181, Trigs 40, HDL 30, LDL 133 (12/18/2019 on niacin and fish oil)  Past Medical History:  Diagnosis Date   Diabetes mellitus ([place])    Erectile dysfunction    Hypercholesteremia    Hypertension    Male stress incontinence    Nonalcoholic fatty liver disease    Osteopenia    Prostate cancer ([place])    Sleep apnea    Vitamin D deficiency     Current Outpatient Medications on File Prior to Visit  Medication Sig Dispense Refill   ascorbic acid (VITAMIN C) 1000 MG tablet      aspirin EC 81 MG tablet Take 81 mg by mouth daily.     calcium-vitamin D (OSCAL WITH D) 500-200 MG-UNIT per tablet Take 1 tablet by mouth. Actually taking 600/400     cholecalciferol (VITAMIN D) 1000 UNITS tablet Take 1,000 Units by mouth daily.      clobetasol cream (TEMOVATE) 7.78 % Apply 1 application topically as needed. Per pt he reports uses as needed only     fexofenadine (ALLEGRA) 60 MG tablet Take 180 mg by mouth daily.     lisinopril-hydrochlorothiazide (PRINZIDE,ZESTORETIC) 20-12.5 MG per tablet Take 1 tablet by mouth in the morning and at bedtime.      Multiple Vitamin (MULTIVITAMIN) tablet Take 1 tablet by mouth 2 (two) times a week. Pt states takes multivitamin 2-3 times weekly     niacin 500 MG tablet Take 500 mg by mouth at bedtime. Patient takes 2 daily     Omega-3 Fatty Acids (FISH OIL) 1000 MG CAPS Take 1 capsule by mouth daily.      Potassium Gluconate 2.5 MEQ TABS Take by mouth.     PRESCRIPTION MEDICATION Inject into the muscle 3 (three) times a week. Trimex as needed. Injectable     propranolol (INNOPRAN XL) 80 MG 24 hr capsule Take by mouth.     Psyllium (METAMUCIL FREE & NATURAL) 43 % POWD      zinc gluconate 50 MG tablet  No current facility-administered medications on file prior to visit.    Allergies  Allergen Reactions   Other Other (See Comments)    Patient reports having tried on "at least five different statins" in the last few years, all of which were discontinued (per the patient) after "affecting his liver". Patient is very hesitant about taking trying statins again.    Tetracyclines & Related Other (See Comments)    "boils in mouth"    Assessment/Plan:  1. Hyperlipidemia - Patient last LDL 133 which is above goal of < 55 due to patients PAD and family history.  Will update lipid panel today to get baseline.  Since patient is hesitant to take start statins, discussed next steps such as Nexlizet or PCSK9i.    Using [place] [employer] , educated patient on mechanism of action, storage, site selection, and administration.  Patient voiced understanding.    Will contact patient when lipid panel come back and if he is willing to try Repatha, will complete PA and set up repeat lipid panel.  Patient  voiced understanding.  Karren Cobble, PharmD, BCACP, [place], [place] [address], [place], [place] [address] Phone: [phone]; Fax: [phone] 08/11/2020 4:21 PM

## 2020-08-12 ENCOUNTER — Telehealth: Payer: Self-pay | Admitting: Pharmacist

## 2020-08-12 DIAGNOSIS — E782 Mixed hyperlipidemia: Secondary | ICD-10-CM

## 2020-08-12 MED ORDER — REPATHA SURECLICK 140 MG/ML ~~LOC~~ SOAJ: 1.0000 "pen " | SUBCUTANEOUS | 3 refills | Status: DC

## 2020-08-12 NOTE — Telephone Encounter (Signed)
Repatha PA approved through 02/08/21. Rx sent to [facility] per pt request. Follow up labs scheduled in August. Also advised pt he can stop OTC niacin due to lack of CV benefit and normal TG. He verbalized understanding of tx plan.

## 2020-08-12 NOTE — Telephone Encounter (Signed)
Left message for pt to discuss lab results. LDL elevated at 127 above goal < 55 given hx of PAD and DM. Hx of LFT elevation on multiple statins. Will submit prior authorization for Repatha. Anticipated copay $45/month with his [employer] Part D plan.

## 2020-08-12 NOTE — Telephone Encounter (Signed)
Pt returned call to clinic. He is aware of lab results and need to start [place]. He is ok with copay as well. Prior auth still pending. He's aware we will send in rx and give him a call once insurance approves the request.

## 2020-10-24 ENCOUNTER — Other Ambulatory Visit: Payer: Self-pay

## 2020-10-24 ENCOUNTER — Other Ambulatory Visit: Payer: Medicare HMO | Admitting: *Deleted

## 2020-10-24 DIAGNOSIS — E782 Mixed hyperlipidemia: Secondary | ICD-10-CM

## 2020-10-24 LAB — HEPATIC FUNCTION PANEL
ALT: 84 IU/L — ABNORMAL HIGH (ref 0–44)
AST: 72 IU/L — ABNORMAL HIGH (ref 0–40)
Albumin: 4.4 g/dL (ref 3.7–4.7)
Alkaline Phosphatase: 81 IU/L (ref 44–121)
Bilirubin Total: 0.6 mg/dL (ref 0.0–1.2)
Bilirubin, Direct: 0.25 mg/dL (ref 0.00–0.40)
Total Protein: 6.6 g/dL (ref 6.0–8.5)

## 2020-10-24 LAB — LIPID PANEL
Chol/HDL Ratio: 1.9 ratio (ref 0.0–5.0)
Cholesterol, Total: 64 mg/dL — ABNORMAL LOW (ref 100–199)
HDL: 33 mg/dL — ABNORMAL LOW (ref >39)
LDL Chol Calc (NIH): 18 mg/dL (ref 0–99)
Triglycerides: 45 mg/dL (ref 0–149)
VLDL Cholesterol Cal: 13 mg/dL (ref 5–40)

## 2020-10-25 ENCOUNTER — Telehealth: Payer: Self-pay | Admitting: Pharmacist

## 2020-10-25 NOTE — Telephone Encounter (Signed)
LMOM to discuss lab results

## 2020-12-28 ENCOUNTER — Ambulatory Visit: Payer: Medicare HMO | Admitting: Podiatry

## 2020-12-28 ENCOUNTER — Other Ambulatory Visit: Payer: Self-pay

## 2020-12-28 DIAGNOSIS — B351 Tinea unguium: Secondary | ICD-10-CM

## 2020-12-28 DIAGNOSIS — M79674 Pain in right toe(s): Secondary | ICD-10-CM | POA: Diagnosis not present

## 2020-12-28 DIAGNOSIS — M79675 Pain in left toe(s): Secondary | ICD-10-CM | POA: Diagnosis not present

## 2020-12-29 ENCOUNTER — Encounter: Payer: Self-pay | Admitting: Podiatry

## 2020-12-29 NOTE — Progress Notes (Signed)
  Subjective:  Patient ID: [name], male    DOB: 10-29-1945,  MRN: [id number]  Chief Complaint  Patient presents with   Nail Problem    Nail trim    75 y.o. male returns for the above complaint.  Patient presents with thickened elongated dystrophic toenails x10.  Painful to touch.  He is not a diabetic.  He would like for me to nail debride the nails down his not able to do it himself.  He denies any other acute complaints  Objective:  There were no vitals filed for this visit. Podiatric Exam: Vascular: dorsalis pedis and posterior tibial pulses are palpable bilateral. Capillary return is immediate. Temperature gradient is WNL. Skin turgor WNL  Sensorium: Normal Semmes Weinstein monofilament test. Normal tactile sensation bilaterally. Nail Exam: Pt has thick disfigured discolored nails with subungual debris noted bilateral entire nail hallux through fifth toenails.  Pain on palpation to the nails. Ulcer Exam: There is no evidence of ulcer or pre-ulcerative changes or infection. Orthopedic Exam: Muscle tone and strength are WNL. No limitations in general ROM. No crepitus or effusions noted.  Skin: No Porokeratosis. No infection or ulcers    Assessment & Plan:   1. Pain due to onychomycosis of toenails of both feet     Patient was evaluated and treated and all questions answered.  Onychomycosis with pain  -Nails palliatively debrided as below. -Educated on self-care  Procedure: Nail Debridement Rationale: pain  Type of Debridement: manual, sharp debridement. Instrumentation: Nail nipper, rotary burr. Number of Nails: 10  Procedures and Treatment: Consent by patient was obtained for treatment procedures. The patient understood the discussion of treatment and procedures well. All questions were answered thoroughly reviewed. Debridement of mycotic and hypertrophic toenails, 1 through 5 bilateral and clearing of subungual debris. No ulceration, no infection noted.  Return  Visit-Office Procedure: Patient instructed to return to the office for a follow up visit 3 months for continued evaluation and treatment.  Boneta Lucks, DPM    Return in about 3 months (around 03/30/2021).

## 2021-03-21 DIAGNOSIS — K219 Gastro-esophageal reflux disease without esophagitis: Secondary | ICD-10-CM | POA: Insufficient documentation

## 2021-03-21 DIAGNOSIS — R2689 Other abnormalities of gait and mobility: Secondary | ICD-10-CM | POA: Insufficient documentation

## 2021-04-11 ENCOUNTER — Ambulatory Visit: Payer: Medicare HMO | Admitting: Podiatry

## 2021-08-23 IMAGING — US US RENAL
1 series · 14 of 25 positions shown · non-contrast
Comparison: 06/11/2008

CLINICAL DATA: Chronic kidney disease

EXAM:
RENAL / URINARY TRACT ULTRASOUND COMPLETE

[Series 1: us renal · 0.23mm/px · 14 of 34 slices shown]
[im 1/34]
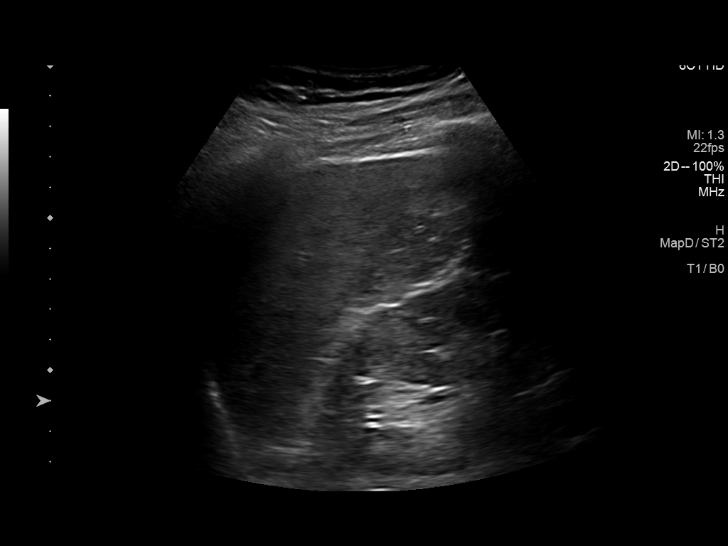
[im 3/34]
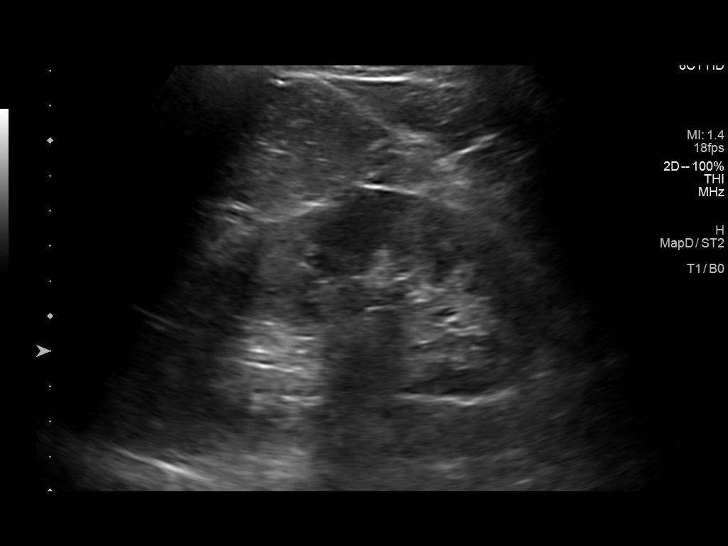
[im 6/34]
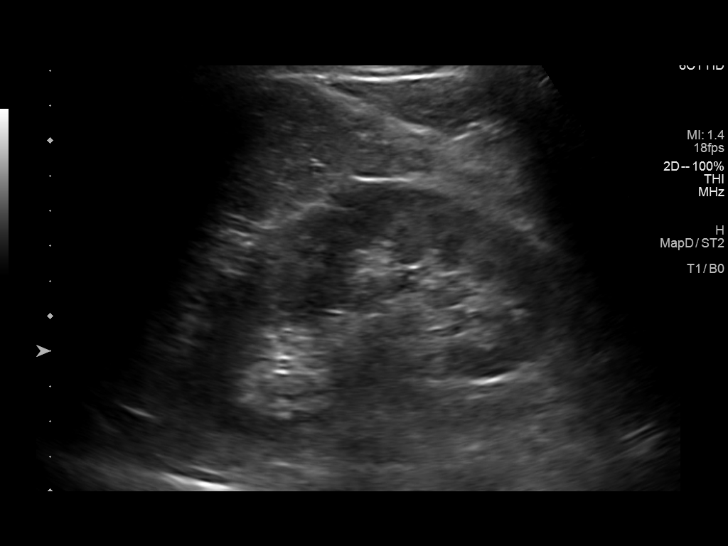
[im 9/34]
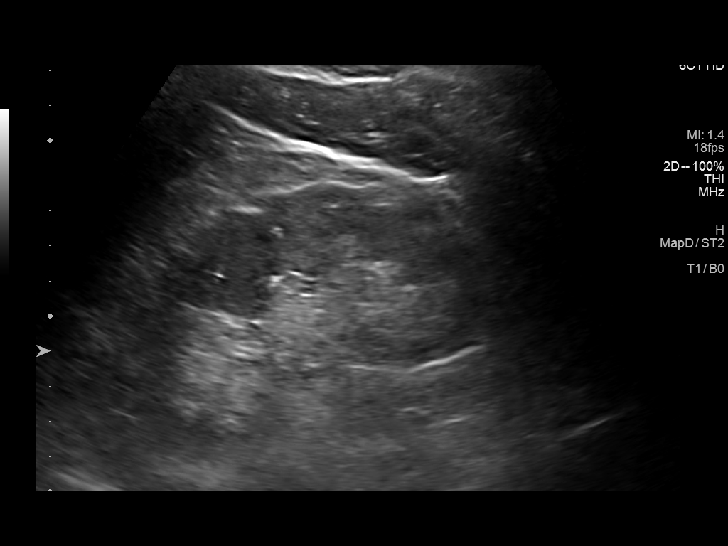
[im 12/34]
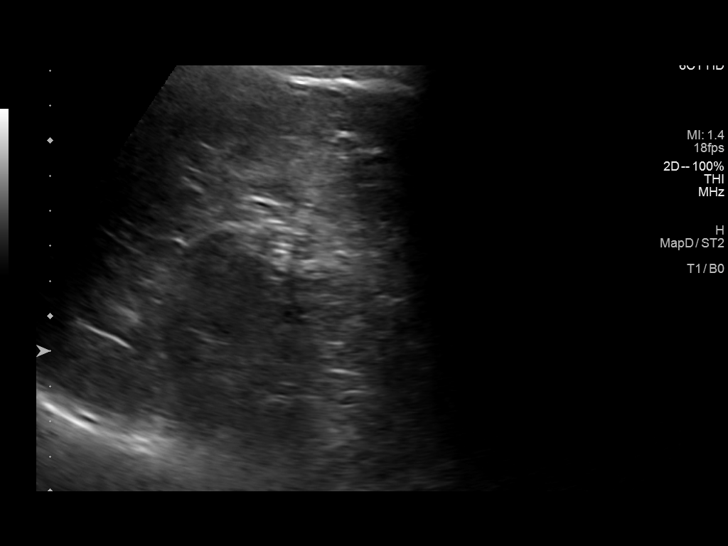
[im 13/34]
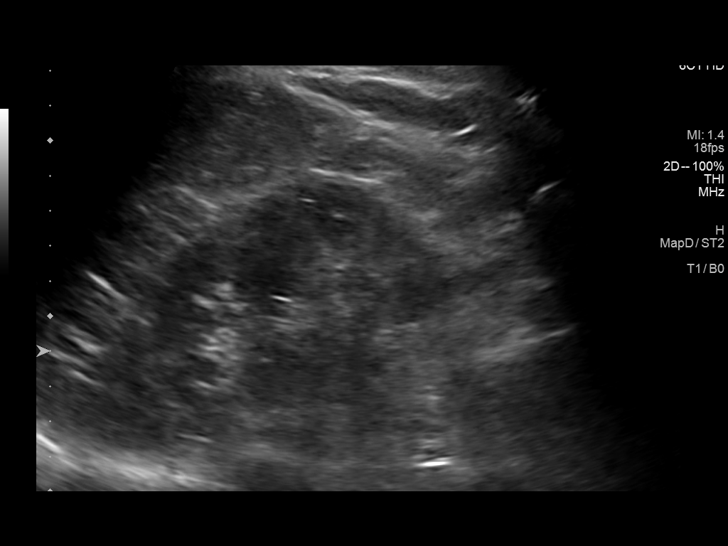
[im 16/34]
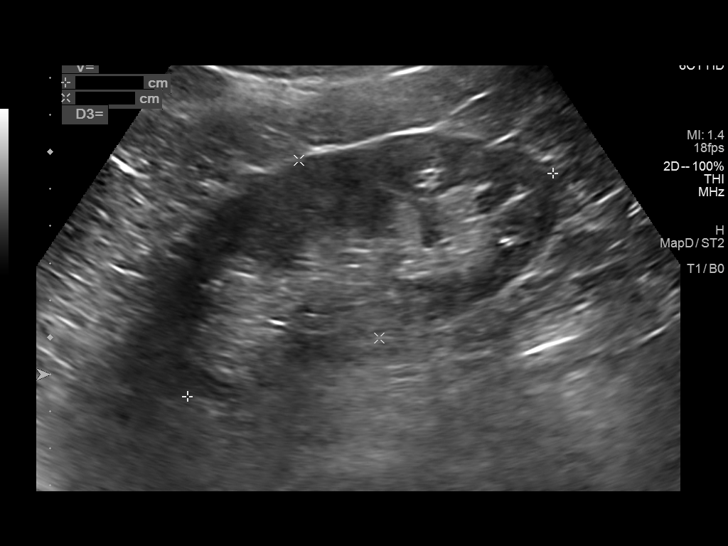
[im 18/34]
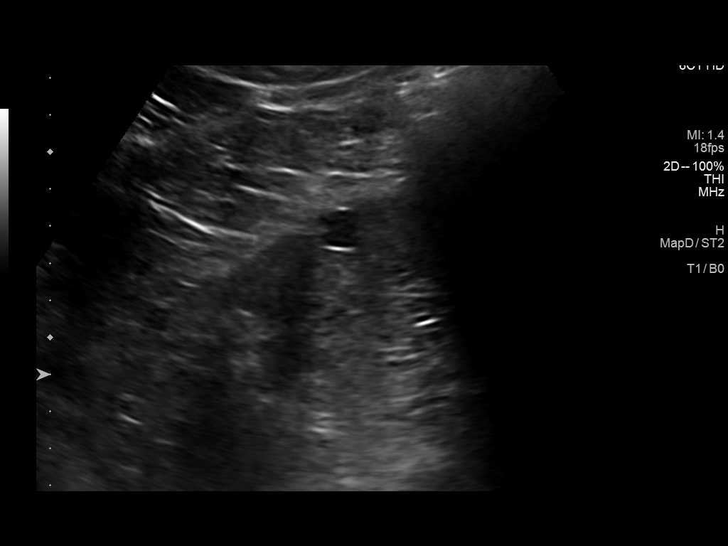
[im 21/34]
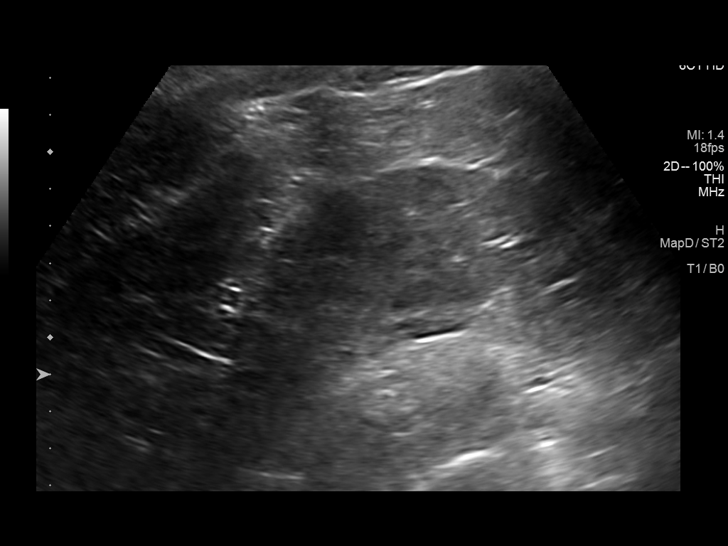
[im 23/34]
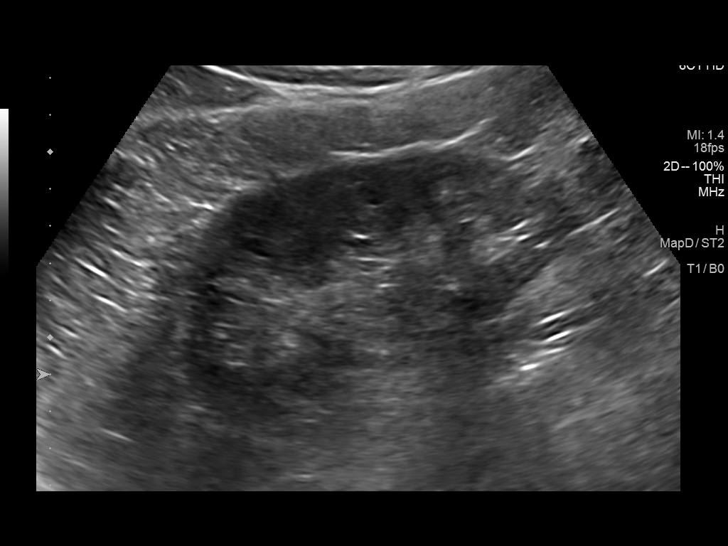
[im 25/34]
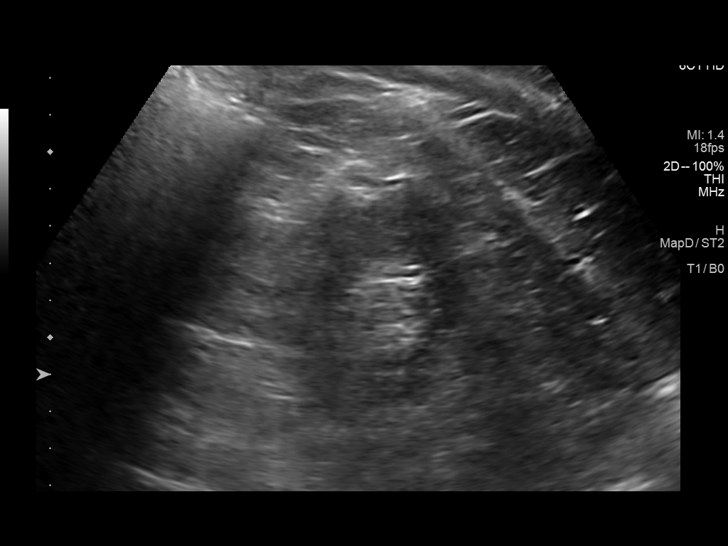
[im 28/34]
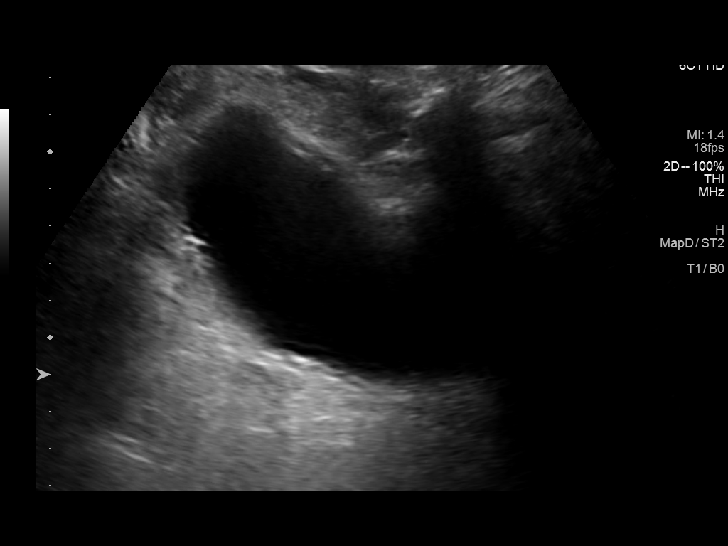
[im 31/34]
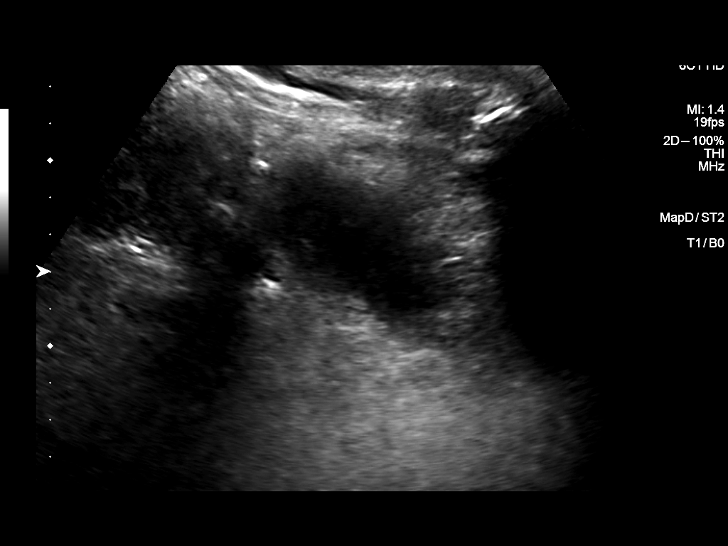
[im 34/34]
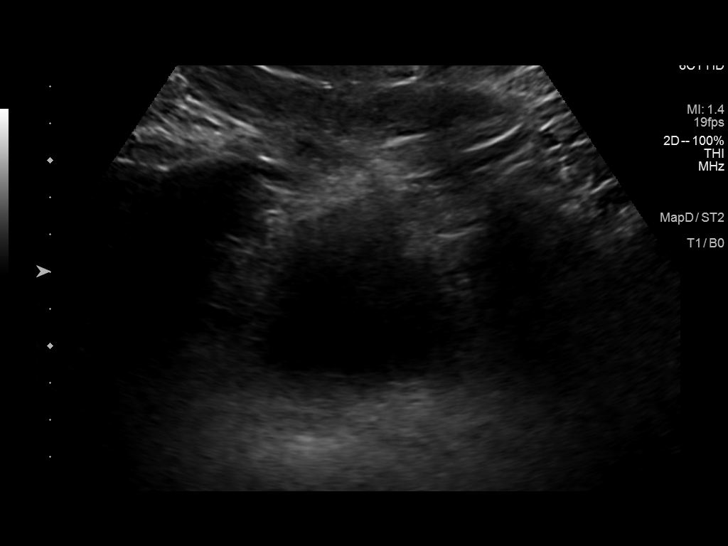

[14 of 25 positions shown; findings below may reference images not displayed]

FINDINGS: Right Kidney:

Renal measurements: 10.7 x 5.6 x 5.8 cm = volume: 181 mL.
Echogenicity within normal limits. No mass, shadowing stone, or
hydronephrosis visualized.

Left Kidney:

Renal measurements: 11.6 x 5.3 x 5.4 cm = volume: 170 mL.
Echogenicity within normal limits. 1.3 cm upper pole simple renal
cyst. No solid mass, shadowing stone, or hydronephrosis visualized.

Bladder:

Appears normal for degree of bladder distention.

Other:

None.
IMPRESSION: No sonographic evidence of obstructive uropathy.

## 2021-10-11 ENCOUNTER — Other Ambulatory Visit: Payer: Self-pay | Admitting: Interventional Cardiology

## 2022-03-07 ENCOUNTER — Other Ambulatory Visit (HOSPITAL_COMMUNITY): Payer: Self-pay

## 2023-05-30 ENCOUNTER — Other Ambulatory Visit: Payer: Self-pay | Admitting: Family Medicine

## 2023-05-30 DIAGNOSIS — R2681 Unsteadiness on feet: Secondary | ICD-10-CM

## 2023-08-12 DIAGNOSIS — K1370 Unspecified lesions of oral mucosa: Secondary | ICD-10-CM | POA: Insufficient documentation

## 2023-08-13 ENCOUNTER — Emergency Department (HOSPITAL_BASED_OUTPATIENT_CLINIC_OR_DEPARTMENT_OTHER)

## 2023-08-13 ENCOUNTER — Other Ambulatory Visit: Payer: Self-pay

## 2023-08-13 ENCOUNTER — Emergency Department (HOSPITAL_BASED_OUTPATIENT_CLINIC_OR_DEPARTMENT_OTHER)
Admission: EM | Admit: 2023-08-13 | Discharge: 2023-08-14 | Disposition: A | Discharge status: Home / Self Care | Attending: Emergency Medicine | Admitting: Emergency Medicine

## 2023-08-13 ENCOUNTER — Encounter (HOSPITAL_BASED_OUTPATIENT_CLINIC_OR_DEPARTMENT_OTHER): Payer: Self-pay

## 2023-08-13 DIAGNOSIS — Z87891 Personal history of nicotine dependence: Secondary | ICD-10-CM | POA: Diagnosis not present

## 2023-08-13 DIAGNOSIS — I10 Essential (primary) hypertension: Secondary | ICD-10-CM | POA: Diagnosis not present

## 2023-08-13 DIAGNOSIS — M546 Pain in thoracic spine: Secondary | ICD-10-CM | POA: Insufficient documentation

## 2023-08-13 DIAGNOSIS — M542 Cervicalgia: Secondary | ICD-10-CM | POA: Diagnosis not present

## 2023-08-13 DIAGNOSIS — M79602 Pain in left arm: Secondary | ICD-10-CM | POA: Diagnosis not present

## 2023-08-13 DIAGNOSIS — Z8546 Personal history of malignant neoplasm of prostate: Secondary | ICD-10-CM | POA: Diagnosis not present

## 2023-08-13 DIAGNOSIS — E119 Type 2 diabetes mellitus without complications: Secondary | ICD-10-CM | POA: Diagnosis not present

## 2023-08-13 DIAGNOSIS — R079 Chest pain, unspecified: Secondary | ICD-10-CM | POA: Insufficient documentation

## 2023-08-13 LAB — TROPONIN T, HIGH SENSITIVITY: Troponin T High Sensitivity: <15 ng/L (ref <19)

## 2023-08-13 LAB — BASIC METABOLIC PANEL WITH GFR
Anion gap: 14 (ref 5–15)
BUN: 22 mg/dL (ref 8–23)
CO2: 21 mmol/L — ABNORMAL LOW (ref 22–32)
Calcium: 10.6 mg/dL — ABNORMAL HIGH (ref 8.9–10.3)
Chloride: 101 mmol/L (ref 98–111)
Creatinine, Ser: 1.92 mg/dL — ABNORMAL HIGH (ref 0.61–1.24)
GFR, Estimated: 36 mL/min — ABNORMAL LOW (ref >60)
Glucose, Bld: 105 mg/dL — ABNORMAL HIGH (ref 70–99)
Potassium: 3.9 mmol/L (ref 3.5–5.1)
Sodium: 136 mmol/L (ref 135–145)

## 2023-08-13 LAB — CBC
HCT: 43.7 % (ref 39.0–52.0)
Hemoglobin: 14.9 g/dL (ref 13.0–17.0)
MCH: 29.1 pg (ref 26.0–34.0)
MCHC: 34.1 g/dL (ref 30.0–36.0)
MCV: 85.4 fL (ref 80.0–100.0)
Platelets: 258 10*3/uL (ref 150–400)
RBC: 5.12 MIL/uL (ref 4.22–5.81)
RDW: 13.3 % (ref 11.5–15.5)
WBC: 9.7 10*3/uL (ref 4.0–10.5)
nRBC: 0 % (ref 0.0–0.2)

## 2023-08-13 MED ORDER — ASPIRIN 81 MG PO CHEW
324.0000 mg | CHEWABLE_TABLET | Freq: Once | ORAL | Status: AC
  Administered 2023-08-13: 324 mg via ORAL
  Filled 2023-08-13: qty 4

## 2023-08-13 MED ORDER — ACETAMINOPHEN 500 MG PO TABS
1000.0000 mg | ORAL_TABLET | Freq: Once | ORAL | Status: AC
  Administered 2023-08-13: 1000 mg via ORAL
  Filled 2023-08-13: qty 2

## 2023-08-13 MED ORDER — METHOCARBAMOL 500 MG PO TABS
500.0000 mg | ORAL_TABLET | Freq: Once | ORAL | Status: AC
  Administered 2023-08-13: 500 mg via ORAL
  Filled 2023-08-13: qty 1

## 2023-08-13 NOTE — ED Provider Notes (Signed)
 [facility] Emergency Department Provider Note MRN:  [id number]  Arrival date & time: 08/14/23     Chief Complaint   Chest Pain   History of Present Illness   [name] is a 78 y.o. year-old male with a history of hypertension, prostate cancer presenting to the ED with chief complaint of chest pain.  Pain started yesterday evening in the left thoracic back, described as a dull ache.  Woke up and it was still there and has since been spreading to the left chest, left lateral neck, left arm.  Pain is worse with certain movements or positions.  Pain is not worse with deep breaths.  Denies leg pain or swelling, no shortness of breath, no dizziness or diaphoresis, no nausea or vomiting.  Review of Systems  A thorough review of systems was obtained and all systems are negative except as noted in the HPI and PMH.   Patient's Health History    Past Medical History:  Diagnosis Date   Diabetes mellitus (HCC)    Erectile dysfunction    Hypercholesteremia    Hypertension    Male stress incontinence    Nonalcoholic fatty liver disease    Osteopenia    Prostate cancer (HCC)    Sleep apnea    Vitamin D  deficiency     Past Surgical History:  Procedure Laterality Date   cholecytectomy     FOOT SURGERY     History of colonoscopy     hx of Prostatect Retropubic Radical w/ Nerve Sparing Laparoscopic     Laparoscopy Repair of Initial Inguinal Hernia     LIVER BIOPSY      Family History  Problem Relation Age of Onset   Heart attack Father 43   Diabetes Mellitus I Mother    Hypertension Mother    Stroke Mother     Social History   Socioeconomic History   Marital status: Married    Spouse name: Not on file   Number of children: 3   Years of education: Not on file   Highest education level: Not on file  Occupational History   Occupation: Doctor, hospital  Tobacco Use   Smoking status: Former    Current packs/day: 1.00    Average packs/day: 1 pack/day  for 20.0 years (20.0 ttl pk-yrs)    Types: Cigarettes   Smokeless tobacco: Never   Tobacco comments:    Quit 20 years ago  Substance and Sexual Activity   Alcohol use: No    Alcohol/week: 0.0 standard drinks of alcohol   Drug use: Not on file   Sexual activity: Not on file  Other Topics Concern   Not on file  Social History Narrative   Not on file   Social Drivers of Health   Financial Resource Strain: Not on file  Food Insecurity: Low Risk  (08/01/2023)   Received from [facility]   Hunger Vital Sign    Within the past 12 months, you worried that your food would run out before you got money to buy more: Never true    Within the past 12 months, the food you bought just didn't last and you didn't have money to get more. : Never true  Transportation Needs: No Transportation Needs (08/01/2023)   Received from [facility]    In the past 12 months, has lack of reliable transportation kept you from medical appointments, meetings, work or from getting things needed for daily living? : No  Physical Activity:  Not on file  Stress: Not on file  Social Connections: Not on file  Intimate Partner Violence: Not on file     Physical Exam   Vitals:   08/13/23 2350 08/14/23 0000  BP: 126/69 116/68  Pulse: (!) 58 (!) 57  Resp: 17 15  SpO2: 94% 96%    CONSTITUTIONAL: Well-appearing, NAD NEURO/PSYCH:  Alert and oriented x 3, no focal deficits EYES:  eyes equal and reactive ENT/NECK:  no LAD, no JVD CARDIO: Regular rate, well-perfused, normal S1 and S2 PULM:  CTAB no wheezing or rhonchi GI/GU:  non-distended, non-tender MSK/SPINE:  No gross deformities, no edema SKIN:  no rash, atraumatic   *Additional and/or pertinent findings included in MDM below  Diagnostic and Interventional Summary    EKG Interpretation Date/Time:  Tuesday August 13 2023 22:02:04 EDT Ventricular Rate:  61 PR Interval:  208 QRS Duration:  98 QT Interval:  429 QTC Calculation: 433 R  Axis:   97  Text Interpretation: Sinus rhythm Inferior infarct, old No significant change since prior 7/10 Confirmed by Racheal Buddle [phone]) on 08/13/2023 10:03:37 PM       Labs Reviewed  BASIC METABOLIC PANEL WITH GFR - Abnormal; Notable for the following components:      Result Value   CO2 21 (*)    Glucose, Bld 105 (*)    Creatinine, Ser 1.92 (*)    Calcium 10.6 (*)    GFR, Estimated 36 (*)    All other components within normal limits  CBC  TROPONIN T, HIGH SENSITIVITY  TROPONIN T, HIGH SENSITIVITY    DG Chest Port 1 View  Final Result      Medications  aspirin chewable tablet 324 mg (324 mg Oral Given 08/13/23 2225)  acetaminophen (TYLENOL) tablet 1,000 mg (1,000 mg Oral Given 08/13/23 2331)  methocarbamol (ROBAXIN) tablet 500 mg (500 mg Oral Given 08/13/23 2331)     Procedures  /  Critical Care Procedures  ED Course and Medical Decision Making  Initial Impression and Ddx Favoring MSK but ACS should be considered.  Per chart review patient has a history of mild PAD, reports a remote history of TIA.  History of hypertension.  And so if you cardiovascular risk factors.  EKG with nonspecific findings, no real change from prior EKG however.  Doubt dissection, doubt PE.  Past medical/surgical history that increases complexity of ED encounter: PAD, TIA  Interpretation of Diagnostics I personally reviewed the EKG and my interpretation is as follows: Sinus rhythm, nonspecific findings, no significant change from prior  No significant blood count or electrolyte disturbance.  Troponin negative  Patient Reassessment and Ultimate Disposition/Management     Patient feeling better on reassessment, vitals remain normal.  Second troponin is negative.  We discussed his risk factors and the disposition options, admission versus discharge.  Given the suspicion for noncardiac pain, patient will be referred to cardiology in the outpatient setting.  Appropriate for discharge.  Patient  management required discussion with the following services or consulting groups:  None  Complexity of Problems Addressed Acute illness or injury that poses threat of life of bodily function  Additional Data Reviewed and Analyzed Further history obtained from: Further history from spouse/family member  Additional Factors Impacting ED Encounter Risk Consideration of hospitalization  Merrick Abe. Harless Lien, MD [facility] Emergency Medicine [facility] [email or web address]  Final Clinical Impressions(s) / ED Diagnoses     ICD-10-CM   1. Chest pain, unspecified type  R07.9 Ambulatory referral to Cardiology  ED Discharge Orders          Ordered    methocarbamol (ROBAXIN) 500 MG tablet  Every 8 hours PRN        08/14/23 0026    Ambulatory referral to Cardiology        08/14/23 0027             Discharge Instructions Discussed with and Provided to Patient:     Discharge Instructions      You were evaluated in the Emergency Department and after careful evaluation, we did not find any emergent condition requiring admission or further testing in the hospital.  Your exam/testing today is overall reassuring.  Suspect symptoms are due to muscular strain or spasm.  Recommend Tylenol 1000 mg every 4-6 hours.  Can use the Robaxin muscle relaxer for more significant pain.  Recommend follow-up with the cardiologist to discuss your symptoms.  Please return to the Emergency Department if you experience any worsening of your condition.   Thank you for allowing us  to be a part of your care.       Edson Graces, MD 08/14/23 Merrianne Abbot

## 2023-08-13 NOTE — ED Triage Notes (Signed)
 Reports CP that began last night. Pt states it started in his back last night, thought it was gas pain since has progressed to the left side now radiating to L arm. C/o SOB on exertion and sweating.

## 2023-08-14 LAB — TROPONIN T, HIGH SENSITIVITY: Troponin T High Sensitivity: <15 ng/L (ref <19)

## 2023-08-14 MED ORDER — METHOCARBAMOL 500 MG PO TABS: 500.0000 mg | ORAL_TABLET | Freq: Three times a day (TID) | ORAL | 0 refills | Status: AC | PRN

## 2023-08-14 MED ORDER — METHOCARBAMOL 500 MG PO TABS
500.0000 mg | ORAL_TABLET | Freq: Three times a day (TID) | ORAL | 0 refills | Status: DC | PRN
Start: 2023-08-14 — End: 2023-08-14

## 2023-08-14 NOTE — Discharge Instructions (Addendum)
 You were evaluated in the Emergency Department and after careful evaluation, we did not find any emergent condition requiring admission or further testing in the hospital.  Your exam/testing today is overall reassuring.  Suspect symptoms are due to muscular strain or spasm.  Recommend Tylenol 1000 mg every 4-6 hours.  Can use the Robaxin muscle relaxer for more significant pain.  Recommend follow-up with the cardiologist to discuss your symptoms.  Please return to the Emergency Department if you experience any worsening of your condition.   Thank you for allowing us  to be a part of your care.

## 2023-08-28 DIAGNOSIS — H90A21 Sensorineural hearing loss, unilateral, right ear, with restricted hearing on the contralateral side: Secondary | ICD-10-CM | POA: Insufficient documentation

## 2023-08-28 DIAGNOSIS — K625 Hemorrhage of anus and rectum: Secondary | ICD-10-CM | POA: Insufficient documentation

## 2023-08-28 DIAGNOSIS — K219 Gastro-esophageal reflux disease without esophagitis: Secondary | ICD-10-CM | POA: Insufficient documentation

## 2023-08-28 DIAGNOSIS — Z1211 Encounter for screening for malignant neoplasm of colon: Secondary | ICD-10-CM | POA: Insufficient documentation

## 2023-09-02 ENCOUNTER — Ambulatory Visit

## 2023-09-20 NOTE — Progress Notes (Signed)
 [address] - AMBULATORY [facility]  - FAMILY MEDICINE [facility] [address] [place] [address]    Date of service:  10/01/2023  Name:  [name]  Date of Birth:  12/25/1945   SUBJECTIVE   Patient ID: [name] is a 78 y.o. (DOB 1946-02-04) male  Chief Complaint  Patient presents with  . Hypertension  . Hyperlipidemia  . Diarrhea  . Black or Bloody Stool  . Hemorrhoids    Last visit:  08/01/23.  Hypertension  Pt is taking medications as instructed, no medication side effects noted.  Home blood pressure monitoring:  No Exercise: no ROS negative for exertional chest pain, ankle edema, palpitations, or decrease in exercise tolerance.   Borderline hypercholesterolemia  Current treatment Fish Oil Supplement and niacin.  Not experiencing side effects to the medication.   Diarrhea Timeframe: 3-4 weeks Symptoms: black stools, loose, runny, BM every 2 hours, decreased appetite, hot flashes, chills. Mucus like, foul smelling stool. Small amounts of stool.  Pain scale: sharp, shooting, constant rectal pain,5-6/10. 10/10 with shooting pain, and prolonged sitting, tender abdomen. Pt states that he developed diarrhea when he starting taking clindamycin for mass in right side of mouth and sinus infection, prescribed by ENT. Rx was for 10 days, on day 9 symptoms returned. Pt was then prescribed Amox-Clauv 875-125 mg for an additional 10 days. He has severe diarrhea since and progressively worsened over time. He has developed hemorrhoids, and a nickel sized mass at the top of anus. NO fever, nausea or vomiting.  Inadequate water intake. Took 6 pre measured cups of Pepto bismol on 09/26/23 or 09/27/23, black stools since.  Treatment goals reviewed.   BP Readings from Last 3 Encounters:  10/01/23 98/64  08/26/23 125/64  08/22/23 119/74                        Wt Readings from Last 3 Encounters:   10/01/23 112 kg (247 lb 3.2 oz)  08/26/23 117 kg (258 lb)  08/22/23 117 kg (258 lb 3.2 oz)      Lab Results  Component Value Date   CHOL 195 05/23/2023   CHOL 81 12/20/2020   CHOL 181 12/18/2019   Lab Results  Component Value Date   HDL 35 (L) 05/23/2023   HDL 32 (L) 12/20/2020   HDL 30 (L) 12/18/2019   Lab Results  Component Value Date   LDLCALC 129 (H) 05/23/2023   Lab Results  Component Value Date   TRIG 176 (H) 05/23/2023   TRIG 65 12/20/2020   TRIG 90 12/18/2019   No results found for: CHOLHDL   Review of Systems -  All other pertinent systems reviewed and are negative.   Social History[1]  The following portions of the patient's history were reviewed and updated as appropriate: allergies, current medications, past family history, past medical history, past social history, past surgical history and problem list.  OBJECTIVE   Vitals:   10/01/23 0911  BP: 98/64  Pulse: 103  Resp: 16  Temp: 97.5 F (36.4 C)  TempSrc: Temporal  SpO2: 95%  Weight: 112 kg (247 lb 3.2 oz)  Height: 1.803 m (5' 11)    Body mass index is 34.48 kg/m.  Constitutional: Alert - NAD. Appears well-developed and well nourished. Cardiovascular: Normal rate and rhythm.  Exam reveals no gallop and no friction rub.  No murmur heard.  Pulmonary/chest: Effort normal  and breath sounds normal. No wheezes. No rales.  Extremities: No edema bilaterally.   ASSESSMENT/PLAN   Problem List Items Addressed This Visit     Essential hypertension - Primary   Controlled, BP at goal. Continue current management.       Borderline hypercholesterolemia   Lipids not in desired range when last checked Diet low in refined carbohydrates, sugars and unhealthy fats advised.       H/O prostate cancer   Relevant Orders   PSA, Total (Diagnostic) With Reflex to PSA, Free if Indicated   Other Visit Diagnoses       Hemorrhoids, unspecified hemorrhoid type         Antibiotic-associated diarrhea        Relevant Orders   Clostridioides difficile Antigen and Toxin EIA   Hemoglobin and Hematocrit   Clostridioides difficile Antigen and Toxin EIA   Basic Metabolic Panel     Ongoing rectal pain for couple of months but diarrhea started after starting clindamycin, he was subsequently switched to Augmentin and it persisted.  He had dark-colored stools but definitely not black until after starting Pepto-Bismol.  Sample not black today as well.  Sample collected for C. difficile & check H&H, BMP He has not been drinking much at all on purpose and has not been eating because he was afraid of the effects, he does not have terrible abdominal pain just some discomfort but does report particularly foul and mucousy stools.  He has a history of anal fissures but reports this pain he has been experiencing is different.  He has a history of CKD, most recent creatinine, eGFR 2.1, 32, I am concerned that is worse because of volume depletion secondary to diarrhea and minimal oral hydration.  Will have him follow-up with his gastroenterologist regarding ongoing pain, may be have able to do rectal exam by that time.  He does have a history of hemorrhoids but pain unlike usual hemorrhoid pain as well.  No bright red blood. Risks, benefits, and alternatives of the medication(s) and treatment plan(s) were discussed, and he expressed understanding. Plan follow up as discussed or as needed. No barriers to treatment identified in this visit.   Return for 6 months, follow up chronic condition.  Current Outpatient Medications  Medication Instructions  . ascorbic acid (VITAMIN C) 1,000 mg tablet   . aspirin  81 mg, Daily  . calcium carbonate-vitamin D3 (OS-CAL + D) 600 mg-10 mcg (400 unit) tablet   . cholecalciferol (VITAMIN D3) 1,000 Units, Daily  . clobetasoL (TEMOVATE) 0.05 % cream Apply nickle sized to affected area once daily.  . cyanocobalamin, vitamin B-12, 1,000 mcg ER tablet Take  by mouth.  . docosahexaenoic  acid-epa (Fish OiL) 120-180 mg cap Take  by mouth.  . fexofenadine (ALLEGRA) 180 mg, Daily  . lisinopriL-hydroCHLOROthiazide (PRINZIDE) 20-12.5 mg per tablet TAKE 2 TABLETS ONE TIME DAILY  . methocarbamoL  (ROBAXIN ) 500 mg  . multivitamin cap 1 capsule, Daily  . niacin (VITAMIN B3) 50 mg, Nightly  . potassium gluconate 595 mg (99 mg) tablet Take  by mouth.  . propranoloL (INDERAL LA) 80 mg 24 hr capsule TAKE 1 CAPSULE EVERY DAY  . psyllium husk, with sugar, (Metamucil Free) 3 gram/7 gram powd     This document serves as a record of services personally performed by Madelin Brought, MD.  It was created on their behalf by Rolin LOISE Ka, CMA, a trained medical scribe, and Certified Medical Assistant (CMA). During the course of documenting the history, physical  exam and medical decision making, I was functioning as a medical scribe. The creation of this record is the provider's dictation and/or activities during the visit.  Electronically signed by Rolin LOISE Ka, CMA 09/20/2023 3:39 PM  This document serves as a record of services personally performed by Madelin Brought, MD.  It was created on their behalf by Gwenlyn Cable, CMA, a trained medical scribe, and Certified Medical Assistant (CMA). During the course of documenting the history, physical exam and medical decision making, I was functioning as a Stage manager. The creation of this record is the provider's dictation and/or activities during the visit.   Electronically signed by: Gwenlyn Cable, CMA 10/01/2023 9:11 AM   I agree the documentation is accurate and complete.  Electronically signed by: Madelin Rachel Brought, MD 10/01/2023 12:23 PM         [1] Social History Tobacco Use  . Smoking status: Former    Passive exposure: Never  . Smokeless tobacco: Never  Vaping Use  . Vaping status: Never Used  Substance Use Topics  . Alcohol use: Not Currently  . Drug use: Never

## 2023-10-01 NOTE — Telephone Encounter (Signed)
 Copied from CRM #[id number]. Topic: Clinical Concerns - Lab Results - Additional Questions >> Oct 01, 2023  2:18 PM Odella H wrote: [facility] is calling other request    Include all details related to the request(s) below: [name] with [facility] Dr. [place] office is calling regarding patient has appointment with them next week and they are needing office notes & labs from visit today   Fax# [phone] Phone# [phone]    Confirm and type the Best Contact Number below:  Patient/caller contact number:             [] Home  [] Mobile  [] Work [] Other   [] Okay to leave a voicemail   Medication List:  Current Outpatient Medications:  .  ascorbic acid (VITAMIN C) 1,000 mg tablet, , Disp: , Rfl:  .  aspirin  81 mg chewable tablet, Take 81 mg by mouth Once Daily., Disp: , Rfl:  .  calcium carbonate-vitamin D3 (OS-CAL + D) 600 mg-10 mcg (400 unit) tablet, , Disp: , Rfl:  .  cholecalciferol (VITAMIN D3) 1,000 unit (25 mcg) tablet, Take 1,000 Units by mouth Once Daily., Disp: , Rfl:  .  clobetasoL (TEMOVATE) 0.05 % cream, Apply nickle sized to affected area once daily., Disp: 30 g, Rfl: 1 .  cyanocobalamin, vitamin B-12, 1,000 mcg ER tablet, Take  by mouth., Disp: , Rfl:  .  docosahexaenoic acid-epa (Fish OiL) 120-180 mg cap, Take  by mouth., Disp: , Rfl:  .  fexofenadine (ALLEGRA) 180 mg tablet, Take 180 mg by mouth Once Daily., Disp: , Rfl:  .  lisinopriL-hydroCHLOROthiazide (PRINZIDE) 20-12.5 mg per tablet, TAKE 2 TABLETS ONE TIME DAILY, Disp: 180 tablet, Rfl: 3 .  methocarbamoL  (ROBAXIN ) 500 mg tablet, Take 500 mg by mouth., Disp: , Rfl:  .  multivitamin cap, Take 1 capsule by mouth Once Daily., Disp: , Rfl:  .  niacin (VITAMIN B3) 50 mg tab tablet, Take 50 mg by mouth nightly., Disp: , Rfl:  .  potassium gluconate 595 mg (99 mg) tablet, Take  by mouth., Disp: , Rfl:  .  propranoloL (INDERAL LA) 80 mg 24 hr capsule, TAKE 1 CAPSULE EVERY DAY, Disp: 90 capsule, Rfl:  3 .  psyllium husk, with sugar, (Metamucil Free) 3 gram/7 gram powd, , Disp: , Rfl:      Medication Request/Refills: Pharmacy Information (if applicable)   [] Not Applicable       []  Pharmacy listed  Send Medication Request to:                                                 [] Pharmacy not listed (added to pharmacy list in Epic) Send Medication Request to:      Listed Pharmacies: [facility] - [place], [place] - [address] - PHONE: [phone] - FAX: [phone] [id number] [place], [place] - [address] AT [facility] OF [address] & [facility] [address] - PHONE: [phone] - FAX: [phone]

## 2023-10-02 ENCOUNTER — Other Ambulatory Visit (HOSPITAL_COMMUNITY): Payer: Self-pay

## 2023-10-02 MED ORDER — FIDAXOMICIN 200 MG PO TABS
200.0000 mg | ORAL_TABLET | Freq: Two times a day (BID) | ORAL | 0 refills | Status: AC
  Filled 2023-10-02: qty 20, 10d supply, fill #0

## 2023-10-02 NOTE — Telephone Encounter (Signed)
 Notes and labs faxed to [facility].

## 2023-10-02 NOTE — Telephone Encounter (Signed)
 Copied from CRM #[id number]. Topic: Clinical Concerns - Medical Question >> Oct 02, 2023 12:19 PM Raoul B wrote: [name] is calling other request    Include all details related to the request(s) below:  Patients wife/[name] is requesting to speak with Gwenlyn regarding patients visit yesterday and medication.   Decline to provide more information    Confirm and type the Best Contact Number below:  Patient/caller contact number:  [phone]           [] Home  [] Mobile  [] Work [] Other   [x] Okay to leave a voicemail   Medication List:  Current Outpatient Medications:  .  ascorbic acid (VITAMIN C) 1,000 mg tablet, , Disp: , Rfl:  .  aspirin  81 mg chewable tablet, Take 81 mg by mouth Once Daily., Disp: , Rfl:  .  calcium carbonate-vitamin D3 (OS-CAL + D) 600 mg-10 mcg (400 unit) tablet, , Disp: , Rfl:  .  cholecalciferol (VITAMIN D3) 1,000 unit (25 mcg) tablet, Take 1,000 Units by mouth Once Daily., Disp: , Rfl:  .  clobetasoL (TEMOVATE) 0.05 % cream, Apply nickle sized to affected area once daily., Disp: 30 g, Rfl: 1 .  cyanocobalamin, vitamin B-12, 1,000 mcg ER tablet, Take  by mouth., Disp: , Rfl:  .  docosahexaenoic acid-epa (Fish OiL) 120-180 mg cap, Take  by mouth., Disp: , Rfl:  .  fexofenadine (ALLEGRA) 180 mg tablet, Take 180 mg by mouth Once Daily., Disp: , Rfl:  .  fidaxomicin  (DIFICID ) 200 mg tablet, Take 1 tablet (200 mg total) by mouth 2 (two) times a day for 10 days., Disp: 20 tablet, Rfl: 0 .  lisinopriL-hydroCHLOROthiazide (PRINZIDE) 20-12.5 mg per tablet, TAKE 2 TABLETS ONE TIME DAILY, Disp: 180 tablet, Rfl: 3 .  methocarbamoL  (ROBAXIN ) 500 mg tablet, Take 500 mg by mouth., Disp: , Rfl:  .  multivitamin cap, Take 1 capsule by mouth Once Daily., Disp: , Rfl:  .  niacin (VITAMIN B3) 50 mg tab tablet, Take 50 mg by mouth nightly., Disp: , Rfl:  .  potassium gluconate 595 mg (99 mg) tablet, Take  by mouth., Disp: , Rfl:  .  propranoloL (INDERAL LA) 80 mg 24 hr  capsule, TAKE 1 CAPSULE EVERY DAY, Disp: 90 capsule, Rfl: 3 .  psyllium husk, with sugar, (Metamucil Free) 3 gram/7 gram powd, , Disp: , Rfl:      Medication Request/Refills: Pharmacy Information (if applicable)   [] Not Applicable       []  Pharmacy listed  Send Medication Request to:                                                 [] Pharmacy not listed (added to pharmacy list in Epic) Send Medication Request to:      Listed Pharmacies: [facility] - [place], [place] - [address] - PHONE: [phone] - FAX: [phone] [id number] [place], [place] - [address] AT [facility] OF [address] & [facility] [address] - PHONE: [phone] - FAX: [phone]

## 2023-10-03 NOTE — Telephone Encounter (Signed)
 Spoke with wife today regarding results. She asked if he could take anything for pain. Spoke with PCP and she will be sending something for pain. Pt and wife aware.

## 2023-10-03 NOTE — Telephone Encounter (Signed)
 Patient wife is calling back and states that she spoke to Dr. Elmer office and they have not received records yet. She questions if records can be faxed to them .    [phone]

## 2023-10-04 ENCOUNTER — Emergency Department (HOSPITAL_BASED_OUTPATIENT_CLINIC_OR_DEPARTMENT_OTHER)
Admission: EM | Admit: 2023-10-04 | Discharge: 2023-10-04 | Disposition: A | Discharge status: Home / Self Care | Source: Ambulatory Visit | Attending: Emergency Medicine | Admitting: Emergency Medicine

## 2023-10-04 ENCOUNTER — Emergency Department (HOSPITAL_BASED_OUTPATIENT_CLINIC_OR_DEPARTMENT_OTHER)

## 2023-10-04 ENCOUNTER — Other Ambulatory Visit: Payer: Self-pay

## 2023-10-04 ENCOUNTER — Encounter (HOSPITAL_BASED_OUTPATIENT_CLINIC_OR_DEPARTMENT_OTHER): Payer: Self-pay

## 2023-10-04 DIAGNOSIS — A0472 Enterocolitis due to Clostridium difficile, not specified as recurrent: Secondary | ICD-10-CM | POA: Diagnosis not present

## 2023-10-04 DIAGNOSIS — I10 Essential (primary) hypertension: Secondary | ICD-10-CM | POA: Insufficient documentation

## 2023-10-04 DIAGNOSIS — E876 Hypokalemia: Secondary | ICD-10-CM | POA: Diagnosis not present

## 2023-10-04 DIAGNOSIS — Z8546 Personal history of malignant neoplasm of prostate: Secondary | ICD-10-CM | POA: Insufficient documentation

## 2023-10-04 DIAGNOSIS — Z7982 Long term (current) use of aspirin: Secondary | ICD-10-CM | POA: Diagnosis not present

## 2023-10-04 DIAGNOSIS — E119 Type 2 diabetes mellitus without complications: Secondary | ICD-10-CM | POA: Diagnosis not present

## 2023-10-04 DIAGNOSIS — K625 Hemorrhage of anus and rectum: Secondary | ICD-10-CM | POA: Diagnosis present

## 2023-10-04 HISTORY — DX: Enterocolitis due to Clostridium difficile, not specified as recurrent: A04.72

## 2023-10-04 LAB — URINALYSIS, ROUTINE W REFLEX MICROSCOPIC
Bilirubin Urine: NEGATIVE
Glucose, UA: NEGATIVE mg/dL
Hgb urine dipstick: NEGATIVE
Ketones, ur: NEGATIVE mg/dL
Leukocytes,Ua: NEGATIVE
Nitrite: NEGATIVE
Specific Gravity, Urine: 1.014 (ref 1.005–1.030)
pH: 5.5 (ref 5.0–8.0)

## 2023-10-04 LAB — CBC WITH DIFFERENTIAL/PLATELET
Abs Immature Granulocytes: 0.22 K/uL — ABNORMAL HIGH (ref 0.00–0.07)
Basophils Absolute: 0.1 K/uL (ref 0.0–0.1)
Basophils Relative: 1 %
Eosinophils Absolute: 0.2 K/uL (ref 0.0–0.5)
Eosinophils Relative: 2 %
HCT: 41.2 % (ref 39.0–52.0)
Hemoglobin: 14.6 g/dL (ref 13.0–17.0)
Immature Granulocytes: 2 %
Lymphocytes Relative: 15 %
Lymphs Abs: 1.4 K/uL (ref 0.7–4.0)
MCH: 29.7 pg (ref 26.0–34.0)
MCHC: 35.4 g/dL (ref 30.0–36.0)
MCV: 83.7 fL (ref 80.0–100.0)
Monocytes Absolute: 0.8 K/uL (ref 0.1–1.0)
Monocytes Relative: 9 %
Neutro Abs: 6.5 K/uL (ref 1.7–7.7)
Neutrophils Relative %: 71 %
Platelets: 294 K/uL (ref 150–400)
RBC: 4.92 MIL/uL (ref 4.22–5.81)
RDW: 12.5 % (ref 11.5–15.5)
WBC: 9.2 K/uL (ref 4.0–10.5)
nRBC: 0 % (ref 0.0–0.2)

## 2023-10-04 LAB — COMPREHENSIVE METABOLIC PANEL WITH GFR
ALT: 39 U/L (ref 0–44)
AST: 37 U/L (ref 15–41)
Albumin: 3.8 g/dL (ref 3.5–5.0)
Alkaline Phosphatase: 62 U/L (ref 38–126)
Anion gap: 15 (ref 5–15)
BUN: 24 mg/dL — ABNORMAL HIGH (ref 8–23)
CO2: 23 mmol/L (ref 22–32)
Calcium: 10 mg/dL (ref 8.9–10.3)
Chloride: 97 mmol/L — ABNORMAL LOW (ref 98–111)
Creatinine, Ser: 2.11 mg/dL — ABNORMAL HIGH (ref 0.61–1.24)
GFR, Estimated: 32 mL/min — ABNORMAL LOW (ref >60)
Glucose, Bld: 117 mg/dL — ABNORMAL HIGH (ref 70–99)
Potassium: 3.4 mmol/L — ABNORMAL LOW (ref 3.5–5.1)
Sodium: 135 mmol/L (ref 135–145)
Total Bilirubin: 0.6 mg/dL (ref 0.0–1.2)
Total Protein: 6.7 g/dL (ref 6.5–8.1)

## 2023-10-04 LAB — LIPASE, BLOOD: Lipase: 37 U/L (ref 11–51)

## 2023-10-04 MED ORDER — SODIUM CHLORIDE 0.9 % IV BOLUS
500.0000 mL | Freq: Once | INTRAVENOUS | Status: AC
  Administered 2023-10-04: 500 mL via INTRAVENOUS

## 2023-10-04 MED ORDER — POTASSIUM CHLORIDE CRYS ER 20 MEQ PO TBCR
40.0000 meq | EXTENDED_RELEASE_TABLET | Freq: Once | ORAL | Status: AC
  Administered 2023-10-04: 40 meq via ORAL
  Filled 2023-10-04: qty 2

## 2023-10-04 NOTE — Discharge Instructions (Signed)
 It is a pleasure taking care of you here today Continue taking the medication once for your C. difficile infection  Warm compress to your bottom  Return for any new or worsening symptoms otherwise follow-up with Dr. Kristie

## 2023-10-04 NOTE — ED Triage Notes (Signed)
 Rectal mass with rectal bleeding onset yesterday evening. Spoke with GI today and was advised to come to ED for care. Diag with C.diff on 08/05 from antibiotic use.

## 2023-10-04 NOTE — ED Provider Notes (Signed)
 [place] EMERGENCY DEPARTMENT AT [facility] Provider Note   CSN: [id number] Arrival date & time: 10/04/23  1621     Patient presents with: Rectal Bleeding   [name] is a 78 y.o. male.   Patient with history of chronic anal fissure, diabetes, hypertension, hyperlipidemia, prostate cancer presents today with complaints of rectal bleeding. Reports that for the last week he has had a mass right next to his rectum with what felt like internal swelling and rectal pain. Yesterday evening, he rolled over and felt the area open and reports there was blood in his underwear. Reports the swollen area is open and draining blood. Denies any actual blood in his stool. Of note, patient is currently on oral vancomycin for c diff, he is on day 5 of 10 of treatment, reports that his diarrhea has improved but has not resolved.  He has not had a formed stool yet.  Reports that since the area opened up he has had decrease in his pain. Denies any abdominal pain, nausea, or vomiting.   Rectal Bleeding      Prior to Admission medications   Medication Sig Start Date End Date Taking? Authorizing Provider  ascorbic acid (VITAMIN C) 1000 MG tablet     [provider]  aspirin  EC 81 MG tablet Take 81 mg by mouth daily.    [provider]  calcium-vitamin D  (OSCAL WITH D) 500-200 MG-UNIT per tablet Take 1 tablet by mouth. Actually taking 600/400    [provider]  cholecalciferol (VITAMIN D ) 1000 UNITS tablet Take 1,000 Units by mouth daily.    [provider]  clobetasol cream (TEMOVATE) 0.05 % Apply 1 application topically as needed. Per pt he reports uses as needed only    [provider]  fexofenadine (ALLEGRA) 60 MG tablet Take 180 mg by mouth daily.    [provider]  fidaxomicin  (DIFICID ) 200 MG TABS tablet Take 1 tablet (200 mg total) by mouth 2 (two) times daily for 10 days. 10/01/23     lisinopril-hydrochlorothiazide  (PRINZIDE,ZESTORETIC) 20-12.5 MG per tablet Take 1 tablet by mouth in the morning and at bedtime.     [provider]  methocarbamol  (ROBAXIN ) 500 MG tablet Take 1 tablet (500 mg total) by mouth every 8 (eight) hours as needed for muscle spasms. 08/14/23   Theadore Ozell HERO, MD  Multiple Vitamin (MULTIVITAMIN) tablet Take 1 tablet by mouth 2 (two) times a week. Pt states takes multivitamin 2-3 times weekly    [provider]  Omega-3 Fatty Acids (FISH OIL) 1000 MG CAPS Take 2 capsules by mouth daily.    [provider]  Potassium Gluconate 2.5 MEQ TABS Take by mouth.    [provider]  PRESCRIPTION MEDICATION Inject into the muscle 3 (three) times a week. Trimex as needed. Injectable    [provider]  propranolol (INNOPRAN XL) 80 MG 24 hr capsule Take by mouth. 04/30/19   [provider]  Psyllium (METAMUCIL FREE & NATURAL) 43 % POWD     [provider]  REPATHA  SURECLICK 140 MG/ML SOAJ INJECT 140 MG (1 PEN) INTO THE SKIN EVERY 14 DAYS 10/11/21   Claudene Victory ORN, MD  zinc gluconate 50 MG tablet     [provider]    Allergies: Other, Statins, and Tetracyclines & related    Review of Systems  Gastrointestinal:  Positive for hematochezia.    Updated Vital Signs BP 130/60   Pulse 63   Temp  98.3 F (36.8 C)   Resp 17   Ht 5' 11 (1.803 m)   Wt 112 kg   SpO2 99%   BMI 34.45 kg/m   Physical Exam Vitals and nursing note reviewed. Exam conducted with a chaperone present.  Constitutional:      General: He is not in acute distress.    Appearance: Normal appearance. He is normal weight. He is not ill-appearing, toxic-appearing or diaphoretic.  HENT:     Head: Normocephalic and atraumatic.  Cardiovascular:     Rate and Rhythm: Normal rate.  Pulmonary:     Effort: Pulmonary effort is normal. No respiratory distress.  Abdominal:     General: Abdomen is flat.     Palpations: Abdomen is soft.     Tenderness: There is no  abdominal tenderness.  Genitourinary:    Comments: Open area approximately 2 cm lateral to the anus draining bloody serous appearing fluid. No fluctuance or induration. No purulence. No bleeding from the rectum. No significant tenderness to palpation Musculoskeletal:        General: Normal range of motion.     Cervical back: Normal range of motion.  Skin:    General: Skin is warm and dry.  Neurological:     General: No focal deficit present.     Mental Status: He is alert.  Psychiatric:        Mood and Affect: Mood normal.        Behavior: Behavior normal.     (all labs ordered are listed, but only abnormal results are displayed) Labs Reviewed  COMPREHENSIVE METABOLIC PANEL WITH GFR - Abnormal; Notable for the following components:      Result Value   Potassium 3.4 (*)    Chloride 97 (*)    Glucose, Bld 117 (*)    BUN 24 (*)    Creatinine, Ser 2.11 (*)    GFR, Estimated 32 (*)    All other components within normal limits  CBC WITH DIFFERENTIAL/PLATELET - Abnormal; Notable for the following components:   Abs Immature Granulocytes 0.22 (*)    All other components within normal limits  LIPASE, BLOOD  URINALYSIS, ROUTINE W REFLEX MICROSCOPIC    EKG: None  Radiology: No results found.   Procedures   Medications Ordered in the ED  sodium chloride  0.9 % bolus 500 mL (500 mLs Intravenous New Bag/Given 10/04/23 1846)  potassium chloride  SA (KLOR-CON  M) CR tablet 40 mEq (40 mEq Oral Given 10/04/23 1844)                                    Medical Decision Making Amount and/or Complexity of Data Reviewed Labs: ordered. Radiology: ordered.  Risk Prescription drug management.   This patient is a 78 y.o. male who presents to the ED for concern of rectal bleeding, this involves an extensive number of treatment options, and is a complaint that carries with it a high risk of complications and morbidity. The emergent differential diagnosis prior to evaluation includes, but is  not limited to, perianal abscess, fistula, GI bleed, diverticulosis, vascular ectasia/AVM, inflammatory bowel disease, infectious colitis, mesenteric ischemia or ischemic colitis,  colorectal cancer or polyps, internal hemorrhoids, aortoenteric fistula, rectal foreign body, rectal ulceration or anal fissure.  This is not an exhaustive differential.   Past Medical History / Co-morbidities / Social History:  has a past medical history of C. difficile diarrhea, Diabetes mellitus (HCC), Erectile dysfunction,  Hypercholesteremia, Hypertension, Male stress incontinence, Nonalcoholic fatty liver disease, Osteopenia, Prostate cancer (HCC), Sleep apnea, and Vitamin D  deficiency.  Additional history: Chart reviewed. Pertinent results include: currently undergoing treatment for cdiff  Physical Exam: Physical exam performed. The pertinent findings include: Abdomen soft and nontender.  Open area approximately 2 cm lateral to the anus draining bloody serous appearing fluid. No fluctuance or induration. No purulence. No bleeding from the rectum. No significant tenderness to palpation  Lab Tests: I ordered, and personally interpreted labs.  The pertinent results include:  hgb stable, 14.6, K 3.4, chloride 97, glucose 117, BUN 24, creatinine 2.11 (up from 22 and 1.92 1 month ago)   Imaging Studies: I ordered imaging studies including Ct abdomen pelvis. Imaging pending at shift change   Medications: I ordered medication including oral potassium, fluids  for dehydration, hypokalemia. Reevaluation of the patient after these medicines showed that the patient improved. I have reviewed the patients home medicines and have made adjustments as needed.   Disposition:  Patients CT is pending at shift change, will determine disposition.   Care handoff to [facility] PA-C at shift change. Please see their note for continued evaluation and disposition.  I discussed this case with my attending physician Dr. Dasie  who cosigned this note including patient's presenting symptoms, physical exam, and planned diagnostics and interventions. Attending physician stated agreement with plan or made changes to plan which were implemented.    Final diagnoses:  None    ED Discharge Orders     None          Nora Lauraine DELENA DEVONNA 10/06/23 1929    Dasie Faden, MD 10/07/23 [phone]

## 2023-10-04 NOTE — ED Provider Notes (Signed)
 Care assumed from previous provider.  See note for full HPI.  In summation, 78 year old recent diagnosis of C. difficile colitis.  Here for possible perianal abscess.  Has felt a mass to his buttocks for the last few months, tender.  Yesterday he rolled over and felt a pop and it was bleeding.  He was referred here for evaluation  Rectal exam performed by previous provider.  Had opening which had some serosanguineous fluid however no purulent drainage.  No fluctuance induration to rectum.  Plan to follow-up on CT scan.  If negative can DC home   Hgb stable Physical Exam  BP 110/70   Pulse 63   Temp 98 F (36.7 C)   Resp 18   Ht 5' 11 (1.803 m)   Wt 112 kg   SpO2 98%   BMI 34.45 kg/m   Physical Exam Vitals and nursing note reviewed.  Constitutional:      General: He is not in acute distress.    Appearance: He is well-developed. He is not ill-appearing or diaphoretic.  HENT:     Head: Atraumatic.  Eyes:     Pupils: Pupils are equal, round, and reactive to light.  Cardiovascular:     Rate and Rhythm: Normal rate and regular rhythm.  Pulmonary:     Effort: Pulmonary effort is normal. No respiratory distress.  Abdominal:     General: There is no distension.     Palpations: Abdomen is soft.  Genitourinary:    Comments: Deferred performed by previous provider Musculoskeletal:        General: Normal range of motion.     Cervical back: Normal range of motion and neck supple.  Skin:    General: Skin is warm and dry.  Neurological:     General: No focal deficit present.     Mental Status: He is alert and oriented to person, place, and time.     Procedures  Procedures Labs Reviewed  COMPREHENSIVE METABOLIC PANEL WITH GFR - Abnormal; Notable for the following components:      Result Value   Potassium 3.4 (*)    Chloride 97 (*)    Glucose, Bld 117 (*)    BUN 24 (*)    Creatinine, Ser 2.11 (*)    GFR, Estimated 32 (*)    All other components within normal limits  CBC  WITH DIFFERENTIAL/PLATELET - Abnormal; Notable for the following components:   Abs Immature Granulocytes 0.22 (*)    All other components within normal limits  URINALYSIS, ROUTINE W REFLEX MICROSCOPIC - Abnormal; Notable for the following components:   Protein, ur TRACE (*)    All other components within normal limits  LIPASE, BLOOD   CT ABDOMEN PELVIS WO CONTRAST Result Date: 10/04/2023 CLINICAL DATA:  Abdominal pain, acute, nonlocalized EXAM: CT ABDOMEN AND PELVIS WITHOUT CONTRAST TECHNIQUE: Multidetector CT imaging of the abdomen and pelvis was performed following the standard protocol without IV contrast. RADIATION DOSE REDUCTION: This exam was performed according to the departmental dose-optimization program which includes automated exposure control, adjustment of the mA and/or kV according to patient size and/or use of iterative reconstruction technique. COMPARISON:  Jul 01, 2007 FINDINGS: Of note, the lack of intravenous contrast limits evaluation of the solid organ parenchyma and vascularity. Lower chest: No focal airspace consolidation or pleural effusion. Innumerable calcified tree-in-bud nodules scattered throughout the lung bases, predominantly within the dependent portions of the lower lobe. Hepatobiliary: Subcentimeter hypodensity in the posterior right hepatic dome, too small to definitively characterize, but  likely a small cyst or biliary hamartoma.Cholecystectomy no intrahepatic or extrahepatic biliary ductal dilation. Pancreas: No mass or main ductal dilation. No peripancreatic inflammation or fluid collection. Spleen: Normal size. No mass. Adrenals/Urinary Tract: No adrenal masses. 1.5 cm cyst in the upper pole of the right kidney. A couple of smaller hypodensities noted in the left kidney, also likely cysts. No hydronephrosis or nephrolithiasis. Partially distended urinary bladder without visualized abnormality. Stomach/Bowel: The stomach is decompressed without focal abnormality. No small  bowel wall thickening or inflammation. No small bowel obstruction.Normal appendix. Total colonic diverticulosis. No changes of acute diverticulitis. Vascular/Lymphatic: No aortic aneurysm. Diffuse aortoiliac atherosclerosis. No intraabdominal or pelvic lymphadenopathy. Reproductive: Prostatectomy.  No free pelvic fluid. Other: No pneumoperitoneum, ascites, or mesenteric inflammation. Musculoskeletal: No acute fracture or destructive lesion. Multilevel degenerative disc disease of the spine. Unchanged bone island in L2. IMPRESSION: 1. No acute intra-abdominal or pelvic abnormality. 2. Innumerable calcified tree-in-bud nodules scattered throughout the lung bases, predominantly within the dependent portions of the lower lobe, which may represent changes from chronic aspiration or chronic granulomatous disease. Comparison with outside chest CT recommended to document stability. If none is available, a repeat chest CT in 3-6 months would be recommended. Aortic Atherosclerosis (ICD10-I70.0). Electronically Signed   By: Rogelia Myers M.D.   On: 10/04/2023 19:44    ED Course / MDM     Care assumed from previous provider.  See note for full HPI.  In summation, 78 year old recent diagnosis of C. difficile colitis.  Here for possible perianal abscess.  Has felt a mass to his buttocks for the last few months, tender.  Yesterday he rolled over and felt a pop and it was bleeding.  He was referred here for evaluation  Rectal exam performed by previous provider.  Had opening which had some serosanguineous fluid however no purulent drainage.  No anal bleeding.  No fluctuance induration to rectum.  Plan to follow-up on CT scan.  If negative can DC home  Labs imaging personally viewed and interpreted: No acute abnormality  Discussed results with patient and family in room.  Will have warm compress to area follow-up outpatient.  The patient has been appropriately medically screened and/or stabilized in the ED. I have  low suspicion for any other emergent medical condition which would require further screening, evaluation or treatment in the ED or require inpatient management.  Patient is hemodynamically stable and in no acute distress.  Patient able to ambulate in department prior to ED.  Evaluation does not show acute pathology that would require ongoing or additional emergent interventions while in the emergency department or further inpatient treatment.  I have discussed the diagnosis with the patient and answered all questions.  Pain is been managed while in the emergency department and patient has no further complaints prior to discharge.  Patient is comfortable with plan discussed in room and is stable for discharge at this time.  I have discussed strict return precautions for returning to the emergency department.  Patient was encouraged to follow-up with PCP/specialist refer to at discharge.      Medical Decision Making Amount and/or Complexity of Data Reviewed External Data Reviewed: labs, radiology and notes. Labs: ordered. Decision-making details documented in ED Course. Radiology: ordered and independent interpretation performed. Decision-making details documented in ED Course.  Risk OTC drugs. Prescription drug management. Parenteral controlled substances. Decision regarding hospitalization. Diagnosis or treatment significantly limited by social determinants of health.          Callahan Peddie A, PA-C  10/04/23 2324    Dasie Faden, MD 10/07/23 [phone]

## 2023-10-04 NOTE — Telephone Encounter (Signed)
 Called [facility]. Spoke with Leontine to see if they received pt's paperwork from 10/02/23 but she states that they did not receive it. Fax number confirmed and information re-faxed today.

## 2023-10-31 ENCOUNTER — Other Ambulatory Visit: Payer: Self-pay | Admitting: Family Medicine

## 2023-10-31 DIAGNOSIS — R55 Syncope and collapse: Secondary | ICD-10-CM

## 2023-11-07 ENCOUNTER — Ambulatory Visit
Admission: RE | Admit: 2023-11-07 | Discharge: 2023-11-07 | Disposition: A | Discharge status: Home / Self Care | Source: Ambulatory Visit | Attending: Family Medicine | Admitting: Family Medicine

## 2023-11-07 DIAGNOSIS — R55 Syncope and collapse: Secondary | ICD-10-CM

## 2023-11-13 ENCOUNTER — Other Ambulatory Visit (HOSPITAL_COMMUNITY): Payer: Self-pay

## 2024-02-06 ENCOUNTER — Other Ambulatory Visit (HOSPITAL_COMMUNITY): Payer: Self-pay
